# Patient Record
Sex: Female | Born: 1995 | Race: Black or African American | Hispanic: No | Marital: Single | Smoking: Never smoker
Health system: Southern US, Community
[De-identification: ages and names within clinical notes are randomized; demographics above are authoritative.]

## PROBLEM LIST (undated history)

## (undated) ENCOUNTER — Inpatient Hospital Stay (HOSPITAL_COMMUNITY): Payer: Self-pay

## (undated) ENCOUNTER — Ambulatory Visit: Admission: EM | Payer: Medicaid Other | Source: Home / Self Care

## (undated) DIAGNOSIS — R011 Cardiac murmur, unspecified: Secondary | ICD-10-CM

## (undated) DIAGNOSIS — D649 Anemia, unspecified: Secondary | ICD-10-CM

## (undated) HISTORY — PX: TIBIA FRACTURE SURGERY: SHX806

---
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
## 2017-08-06 NOTE — L&D Delivery Note (Signed)
  Delivery Note At 9:15 PM a viable female was delivered via  (Presentation: ROA;  ).  APGAR: 8/9, ; weight pending  .The shoulders were not forthcoming, so the posterior (right) axilla was grasped with my index finger, and the baby was rotated clockwise into the oblique diameter.  At this point, the (now) anterior shoulder was released, and the baby delivered.  At no time was any traction placed on the baby's head.  After 1 minute, the cord was clamped and cut. 40 units of pitocin diluted in 1000cc LR was infused rapidly IV.  The placenta separated spontaneously and delivered via CCT and maternal pushing effort.  It was inspected and appears to be intact with a 3 VC. The uterus was boggy for a bit, so cytotec 800 mcg given PR.      Anesthesia:  epidrual Episiotomy:  none Lacerations:  1st degree perineal/labial Suture Repair:  Est. Blood Loss (mL):  350  Mom to postpartum.  Baby to Couplet care / Skin to Skin   The above was performed under my direct supervision and guidance by Dr. Shaune PascalSheran Abraham. [name]\ .  Brittney Wyatt 06/19/2018, 9:29 PM

## 2017-10-25 ENCOUNTER — Encounter (HOSPITAL_COMMUNITY): Payer: Self-pay

## 2017-10-25 ENCOUNTER — Other Ambulatory Visit: Payer: Self-pay

## 2017-10-25 ENCOUNTER — Emergency Department (HOSPITAL_COMMUNITY): Payer: Medicaid Other

## 2017-10-25 ENCOUNTER — Emergency Department (HOSPITAL_COMMUNITY)
Admission: EM | Admit: 2017-10-25 | Discharge: 2017-10-25 | Disposition: A | Discharge status: Home / Self Care | Payer: Medicaid Other | Attending: Emergency Medicine | Admitting: Emergency Medicine

## 2017-10-25 DIAGNOSIS — Z7722 Contact with and (suspected) exposure to environmental tobacco smoke (acute) (chronic): Secondary | ICD-10-CM | POA: Insufficient documentation

## 2017-10-25 DIAGNOSIS — Z3A01 Less than 8 weeks gestation of pregnancy: Secondary | ICD-10-CM | POA: Diagnosis not present

## 2017-10-25 DIAGNOSIS — R109 Unspecified abdominal pain: Secondary | ICD-10-CM | POA: Insufficient documentation

## 2017-10-25 DIAGNOSIS — O9989 Other specified diseases and conditions complicating pregnancy, childbirth and the puerperium: Secondary | ICD-10-CM | POA: Diagnosis present

## 2017-10-25 HISTORY — DX: Anemia, unspecified: D64.9

## 2017-10-25 HISTORY — DX: Cardiac murmur, unspecified: R01.1

## 2017-10-25 LAB — COMPREHENSIVE METABOLIC PANEL
ALBUMIN: 4.4 g/dL (ref 3.5–5.0)
ALT: 15 U/L (ref 14–54)
ANION GAP: 8 (ref 5–15)
AST: 22 U/L (ref 15–41)
Alkaline Phosphatase: 64 U/L (ref 38–126)
BILIRUBIN TOTAL: 0.5 mg/dL (ref 0.3–1.2)
BUN: 11 mg/dL (ref 6–20)
CHLORIDE: 107 mmol/L (ref 101–111)
CO2: 21 mmol/L — ABNORMAL LOW (ref 22–32)
Calcium: 9.5 mg/dL (ref 8.9–10.3)
Creatinine, Ser: 0.53 mg/dL (ref 0.44–1.00)
GFR calc Af Amer: >60 mL/min (ref >60)
GFR calc non Af Amer: >60 mL/min (ref >60)
GLUCOSE: 89 mg/dL (ref 65–99)
POTASSIUM: 3.6 mmol/L (ref 3.5–5.1)
Sodium: 136 mmol/L (ref 135–145)
TOTAL PROTEIN: 7.9 g/dL (ref 6.5–8.1)

## 2017-10-25 LAB — URINALYSIS, ROUTINE W REFLEX MICROSCOPIC
Bilirubin Urine: NEGATIVE
GLUCOSE, UA: NEGATIVE mg/dL
Hgb urine dipstick: NEGATIVE
Ketones, ur: NEGATIVE mg/dL
LEUKOCYTES UA: NEGATIVE
NITRITE: NEGATIVE
Protein, ur: NEGATIVE mg/dL
SPECIFIC GRAVITY, URINE: 1.025 (ref 1.005–1.030)
pH: 5 (ref 5.0–8.0)

## 2017-10-25 LAB — CBC
HCT: 41 % (ref 36.0–46.0)
HEMOGLOBIN: 14.3 g/dL (ref 12.0–15.0)
MCH: 31.3 pg (ref 26.0–34.0)
MCHC: 34.9 g/dL (ref 30.0–36.0)
MCV: 89.7 fL (ref 78.0–100.0)
Platelets: 336 10*3/uL (ref 150–400)
RBC: 4.57 MIL/uL (ref 3.87–5.11)
RDW: 11.9 % (ref 11.5–15.5)
WBC: 8.1 10*3/uL (ref 4.0–10.5)

## 2017-10-25 LAB — LIPASE, BLOOD: LIPASE: 26 U/L (ref 11–51)

## 2017-10-25 LAB — I-STAT BETA HCG BLOOD, ED (MC, WL, AP ONLY): I-stat hCG, quantitative: >2000 m[IU]/mL — ABNORMAL HIGH (ref <5)

## 2017-10-25 LAB — HCG, QUANTITATIVE, PREGNANCY: HCG, BETA CHAIN, QUANT, S: 9790 m[IU]/mL — AB (ref <5)

## 2017-10-25 MED ORDER — PRENATAL COMPLETE 14-0.4 MG PO TABS
1.0000 | ORAL_TABLET | Freq: Every day | ORAL | 0 refills | Status: DC
Start: 2017-10-25 — End: 2018-06-18

## 2017-10-25 MED ORDER — METOCLOPRAMIDE HCL 10 MG PO TABS: 10.0000 mg | ORAL_TABLET | Freq: Three times a day (TID) | ORAL | 0 refills | Status: DC | PRN

## 2017-10-25 MED ORDER — ONDANSETRON HCL 4 MG/2ML IJ SOLN
4.0000 mg | Freq: Once | INTRAMUSCULAR | Status: AC
  Administered 2017-10-25: 4 mg via INTRAVENOUS
  Filled 2017-10-25: qty 2

## 2017-10-25 MED ORDER — SODIUM CHLORIDE 0.9 % IV BOLUS (SEPSIS)
1000.0000 mL | Freq: Once | INTRAVENOUS | Status: AC
  Administered 2017-10-25: 1000 mL via INTRAVENOUS

## 2017-10-25 NOTE — Discharge Instructions (Signed)
Your due date now appears to be November 18.

## 2017-10-25 NOTE — ED Provider Notes (Signed)
[facility]-EMERGENCY DEPT Provider Note   CSN: [id number] Arrival date & time: 10/25/17  0859     History   Chief Complaint Chief Complaint  Patient presents with  . Emesis  . Abdominal Pain    HPI Brittney Wyatt is a 22 y.o. female.  HPI Patient presents with lower abdominal pain for last 3 days.  Also has had nausea and vomiting.  No diarrhea.  Pain is dull.  It is constant.  No vaginal bleeding or discharge.  Eating does not change the pain.  Has had a somewhat decreased appetite.  No dysuria.  Patient does not know if she is pregnant.  Has had some chills.  No sick contacts. Past Medical History:  Diagnosis Date  . Anemia   . Heart murmur     There are no active problems to display for this patient.   Past Surgical History:  Procedure Laterality Date  . TIBIA FRACTURE SURGERY Right     OB History   None      Home Medications    Prior to Admission medications   Medication Sig Start Date End Date Taking? Authorizing Provider  metoCLOPramide (REGLAN) 10 MG tablet Take 1 tablet (10 mg total) by mouth every 8 (eight) hours as needed for nausea. 10/25/17   Benjiman Core, MD  Prenatal Vit-Fe Fumarate-FA (PRENATAL COMPLETE) 14-0.4 MG TABS Take 1 tablet by mouth daily. 10/25/17   Benjiman Core, MD    Family History Family History  Problem Relation Age of Onset  . Hypertension Mother   . Diabetes Mother     Social History Social History   Tobacco Use  . Smoking status: Passive Smoke Exposure - Never Smoker  . Smokeless tobacco: Never Used  Substance Use Topics  . Alcohol use: No  . Drug use: No     Allergies   Patient has no known allergies.   Review of Systems Review of Systems  Constitutional: Positive for chills. Negative for fever.  HENT: Negative for congestion.   Respiratory: Negative for shortness of breath.   Cardiovascular: Negative for chest pain.  Gastrointestinal: Positive for abdominal pain, nausea and  vomiting.  Genitourinary: Negative for vaginal bleeding, vaginal discharge and vaginal pain.  Musculoskeletal: Negative for back pain.  Skin: Negative for rash.  Neurological: Positive for speech difficulty.  Hematological: Negative for adenopathy.  Psychiatric/Behavioral: Negative for confusion.     Physical Exam Updated Vital Signs BP 121/69 (BP Location: Right Arm)   Pulse 82   Temp 98.2 F (36.8 C) (Oral)   Resp 14   Ht 5\' 3"  (1.6 m)   Wt 51.3 kg (113 lb)   LMP 09/13/2017   SpO2 100%   BMI 20.02 kg/m   Physical Exam  Constitutional: She appears well-developed.  HENT:  Head: Atraumatic.  Eyes: Pupils are equal, round, and reactive to light.  Cardiovascular: Regular rhythm.  Pulmonary/Chest: Breath sounds normal.  Abdominal:  Lower abdominal tenderness without rebound or guarding.  Neurological: She is alert.  Skin: Skin is warm. Capillary refill takes less than 2 seconds.     ED Treatments / Results  Labs (all labs ordered are listed, but only abnormal results are displayed) Labs Reviewed  COMPREHENSIVE METABOLIC PANEL - Abnormal; Notable for the following components:      Result Value   CO2 21 (*)    All other components within normal limits  URINALYSIS, ROUTINE W REFLEX MICROSCOPIC - Abnormal; Notable for the following components:   APPearance HAZY (*)  All other components within normal limits  HCG, QUANTITATIVE, PREGNANCY - Abnormal; Notable for the following components:   hCG, Beta Chain, Quant, S 9,790 (*)    All other components within normal limits  I-STAT BETA HCG BLOOD, ED ([facility], [facility], [facility] ONLY) - Abnormal; Notable for the following components:   I-stat hCG, quantitative >2,000.0 (*)    All other components within normal limits  LIPASE, BLOOD  CBC    EKG  EKG Interpretation None       Radiology [place] Ob Comp < 14 Wks  Result Date: 10/25/2017 CLINICAL DATA:  Pelvic pain with positive pregnancy test EXAM: OBSTETRIC <14 WK US AND TRANSVAGINAL  OB US TECHNIQUE: Both transabdominal and transvaginal ultrasound examinations were performed for complete evaluation of the gestation as well as the maternal uterus, adnexal regions, and pelvic cul-de-sac. Transvaginal technique was performed to assess early pregnancy. COMPARISON:  None. FINDINGS: Intrauterine gestational sac: Visualized Yolk sac:  Visualized Embryo:  Not visualized Cardiac Activity: Not visualized MSD: 9 mm   5 w   5 d Subchorionic hemorrhage:  None visualized. Maternal uterus/adnexae: Cervical os closed. Maternal adnexal structures bilaterally appear unremarkable. Trace free pelvic fluid noted. IMPRESSION: There is a gestational sac within the uterus which contains a yolk sac. Fetal pole currently not seen. Based on gestational sac size, estimated gestational age is 6-weeks. A follow-up study in 10-14 days to assess for fetal pole advised. Study otherwise unremarkable. Trace free pelvic fluid may be physiologic. Electronically Signed   By: Bretta BangWilliam  Woodruff III M.D.   On: 10/25/2017 12:01   [place] Ob Transvaginal  Result Date: 10/25/2017 CLINICAL DATA:  Pelvic pain with positive pregnancy test EXAM: OBSTETRIC <14 WK US AND TRANSVAGINAL OB US TECHNIQUE: Both transabdominal and transvaginal ultrasound examinations were performed for complete evaluation of the gestation as well as the maternal uterus, adnexal regions, and pelvic cul-de-sac. Transvaginal technique was performed to assess early pregnancy. COMPARISON:  None. FINDINGS: Intrauterine gestational sac: Visualized Yolk sac:  Visualized Embryo:  Not visualized Cardiac Activity: Not visualized MSD: 9 mm   5 w   5 d Subchorionic hemorrhage:  None visualized. Maternal uterus/adnexae: Cervical os closed. Maternal adnexal structures bilaterally appear unremarkable. Trace free pelvic fluid noted. IMPRESSION: There is a gestational sac within the uterus which contains a yolk sac. Fetal pole currently not seen. Based on gestational sac size, estimated  gestational age is 6-weeks. A follow-up study in 10-14 days to assess for fetal pole advised. Study otherwise unremarkable. Trace free pelvic fluid may be physiologic. Electronically Signed   By: Bretta BangWilliam  Woodruff III M.D.   On: 10/25/2017 12:01    Procedures Procedures (including critical care time)  Medications Ordered in ED Medications  sodium chloride 0.9 % bolus 1,000 mL (0 mLs Intravenous Stopped 10/25/17 1241)  ondansetron (ZOFRAN) injection 4 mg (4 mg Intravenous Given 10/25/17 0945)     Initial Impression / Assessment and Plan / ED Course  I have reviewed the triage vital signs and the nursing notes.  Pertinent labs & imaging results that were available during my care of the patient were reviewed by me and considered in my medical decision making (see chart for details).     Patient with nausea and vomiting.  Found to be pregnant.  Not ectopic on ultrasound.  No sickle discussed with patient and will follow up with outpatient clinic.  Pelvic exam deferred by patient.  Final Clinical Impressions(s) / ED Diagnoses   Final diagnoses:  Less than [redacted] weeks gestation  of pregnancy    ED Discharge Orders        Ordered    metoCLOPramide (REGLAN) 10 MG tablet  Every 8 hours PRN     10/25/17 1320    Prenatal Vit-Fe Fumarate-FA (PRENATAL COMPLETE) 14-0.4 MG TABS  Daily     10/25/17 1321       Benjiman Core, MD 10/25/17 1715

## 2017-10-25 NOTE — ED Triage Notes (Signed)
patient c/o mid abdominal pain and emesis x 3 days. Patient denies any diarrhea.

## 2017-12-03 ENCOUNTER — Telehealth: Payer: Self-pay | Admitting: Licensed Clinical Social Worker

## 2017-12-03 NOTE — Telephone Encounter (Signed)
CSW A. Felton Clinton contacted pt regarding scheduled visit. Pt confirmed appt

## 2017-12-04 ENCOUNTER — Encounter: Payer: Self-pay | Admitting: Advanced Practice Midwife

## 2017-12-04 ENCOUNTER — Other Ambulatory Visit: Payer: Self-pay

## 2017-12-04 ENCOUNTER — Other Ambulatory Visit (HOSPITAL_COMMUNITY)
Admission: RE | Admit: 2017-12-04 | Discharge: 2017-12-04 | Disposition: A | Discharge status: Home / Self Care | Payer: Medicaid Other | Source: Ambulatory Visit | Attending: Advanced Practice Midwife | Admitting: Advanced Practice Midwife

## 2017-12-04 ENCOUNTER — Ambulatory Visit (INDEPENDENT_AMBULATORY_CARE_PROVIDER_SITE_OTHER): Payer: Medicaid Other | Admitting: Advanced Practice Midwife

## 2017-12-04 ENCOUNTER — Encounter: Payer: Self-pay | Admitting: *Deleted

## 2017-12-04 VITALS — BP 122/70 | HR 97 | Wt 114.9 lb

## 2017-12-04 DIAGNOSIS — Z3401 Encounter for supervision of normal first pregnancy, first trimester: Secondary | ICD-10-CM

## 2017-12-04 DIAGNOSIS — Z3400 Encounter for supervision of normal first pregnancy, unspecified trimester: Secondary | ICD-10-CM

## 2017-12-04 DIAGNOSIS — Z3403 Encounter for supervision of normal first pregnancy, third trimester: Secondary | ICD-10-CM | POA: Insufficient documentation

## 2017-12-04 LAB — POCT URINALYSIS DIP (DEVICE)
Bilirubin Urine: NEGATIVE
GLUCOSE, UA: NEGATIVE mg/dL
Hgb urine dipstick: NEGATIVE
KETONES UR: 40 mg/dL — AB
Leukocytes, UA: NEGATIVE
Nitrite: NEGATIVE
PH: 7 (ref 5.0–8.0)
PROTEIN: 30 mg/dL — AB
SPECIFIC GRAVITY, URINE: 1.02 (ref 1.005–1.030)
UROBILINOGEN UA: 0.2 mg/dL (ref 0.0–1.0)

## 2017-12-04 NOTE — Progress Notes (Signed)
Subjective:   Brittney Wyatt is a 22 y.o. G1P0 at [redacted]w[redacted]d by LMP being seen today for her first obstetrical visit.  Her obstetrical history is significant for teen pregnancy . Patient does intend to breast feed. Pregnancy history fully reviewed.  Patient currently living with her mother. She is planning on moving in with her sister or the FOB after she has the baby. She reports that "it's ok" living with her mother, but they don't get along that well. Her older sister lives in [place] and has one child of her own. They have a good relationship. She is unsure about birth control. She has used Nexplanon in the past, but had a lot of bleeding with it.  Patient reports nausea and vomiting.  HISTORY: OB History  Gravida Para Term Preterm AB Living  1 0 0 0 0 0  SAB TAB Ectopic Multiple Live Births  0 0 0 0 0    # Outcome Date GA Lbr Len/2nd Weight Sex Delivery Anes PTL Lv  1 Current             Last pap smear was done NA(age) and was NA  Past Medical History:  Diagnosis Date  . Anemia   . Heart murmur    Past Surgical History:  Procedure Laterality Date  . NO PAST SURGERIES    . TIBIA FRACTURE SURGERY Right    Family History  Problem Relation Age of Onset  . Hypertension Mother   . Diabetes Mother    Social History   Tobacco Use  . Smoking status: Passive Smoke Exposure - Never Smoker  . Smokeless tobacco: Never Used  Substance Use Topics  . Alcohol use: No  . Drug use: No   No Known Allergies Current Outpatient Medications on File Prior to Visit  Medication Sig Dispense Refill  . metoCLOPramide (REGLAN) 10 MG tablet Take 1 tablet (10 mg total) by mouth every 8 (eight) hours as needed for nausea. 10 tablet 0  . Prenatal Vit-Fe Fumarate-FA (PRENATAL COMPLETE) 14-0.4 MG TABS Take 1 tablet by mouth daily. (Patient not taking: Reported on 12/04/2017) 60 each 0   No current facility-administered medications on file prior to visit.     Review of Systems Pertinent items  noted in HPI and remainder of comprehensive ROS otherwise negative.  Exam   Vitals:   12/04/17 0913  BP: 122/70  Pulse: 97  Weight: 114 lb 14.4 oz (52.1 kg)      Uterus:   11 weeks +FHT with doppler   Pelvic Exam: Perineum: Defer pelvic exam today   Vulva:    Vagina:     Cervix:    Adnexa:    Bony Pelvis:   System: General: well-developed, well-nourished female in no acute distress   Breast:  normal appearance, no masses or tenderness   Skin: normal coloration and turgor, no rashes   Neurologic: oriented, normal, negative, normal mood   Extremities: normal strength, tone, and muscle mass, ROM of all joints is normal   HEENT PERRLA, extraocular movement intact and sclera clear, anicteric   Mouth/Teeth mucous membranes moist, pharynx normal without lesions and dental hygiene good   Neck supple and no masses   Cardiovascular: regular rate and rhythm   Respiratory:  no respiratory distress, normal breath sounds   Abdomen: soft, non-tender; bowel sounds normal; no masses,  no organomegaly    Assessment:   Pregnancy: G1P0 Patient Active Problem List   Diagnosis Date Noted  . Supervision of normal first teen pregnancy  12/04/2017     Plan:  1. Supervision of normal first pregnancy, antepartum - Culture, OB Urine - Cystic fibrosis gene test - GC/Chlamydia probe amp ([place])not at [facility] - Hemoglobinopathy Evaluation - Obstetric Panel, Including HIV - SMN1 COPY NUMBER ANALYSIS (SMA Carrier Screen) - HgB A1c - [facility] AMB BABYSCRIPTS OPT IN - Genetic Screening   Initial labs drawn. Continue prenatal vitamins. Genetic Screening discussed, AFP and NIPS: requested. Ultrasound discussed; fetal anatomic survey: requested. Problem list reviewed and updated. The nature of [facility] with multiple MDs and other Advanced Practice Providers was explained to patient; also emphasized that residents, students are part of our team. Routine  obstetric precautions reviewed. Return in about 1 month (around 01/01/2018).

## 2017-12-04 NOTE — Patient Instructions (Signed)
Childbirth Education Options: [facility] Department Classes:  Childbirth education classes can help you get ready for a positive parenting experience. You can also meet other expectant parents and get free stuff for your baby. Each class runs for five weeks on the same night and costs $45 for the mother-to-be and her support person. Medicaid covers the cost if you are eligible. Call [phone] to register. [facility] Childbirth Education:  [phone] or [phone] or [email or web address]  Baby & Me Class: Discuss newborn & infant parenting and family adjustment issues with other new mothers in a relaxed environment. Each week brings a new speaker or baby-centered activity. We encourage new mothers to join [place] every Thursday at 11:00am. Babies birth until crawling. No registration or fee. Daddy [employer]: This course offers Dads-to-be the tools and knowledge needed to feel confident on their journey to becoming new fathers. Experienced dads, who have been trained as coaches, teach dads-to-be how to hold, comfort, diaper, swaddle and play with their infant while being able to support the new mom as well. A class for men taught by men. $25/dad Big Brother/Big Sister: Let your children share in the joy of a new brother or sister in this special class designed just for them. Class includes discussion about how families care for babies: swaddling, holding, diapering, safety as well as how they can be helpful in their new role. This class is designed for children ages 45 to 48, but any age is welcome. Please register each child individually. $5/child  Mom Talk: This mom-led group offers support and connection to mothers as they journey through the adjustments and struggles of that sometimes overwhelming first year after the birth of a child. Tuesdays at 10:00am and Thursdays at 6:00pm. Babies welcome. No registration or fee. Breastfeeding Support Group: This group is a mother-to-mother  support circle where moms have the opportunity to share their breastfeeding experiences. A Lactation Consultant is present for questions and concerns. Meets each Tuesday at 11:00am. No fee or registration. Breastfeeding Your Baby: Learn what to expect in the first days of breastfeeding your newborn.  This class will help you feel more confident with the skills needed to begin your breastfeeding experience. Many new mothers are concerned about breastfeeding after leaving the hospital. This class will also address the most common fears and challenges about breastfeeding during the first few weeks, months and beyond. (call for fee) Comfort Techniques and Tour: This 2 hour interactive class will provide you the opportunity to learn & practice hands-on techniques that can help relieve some of the discomfort of labor and encourage your baby to rotate toward the best position for birth. You and your partner will be able to try a variety of labor positions with birth balls and rebozos as well as practice breathing, relaxation, and visualization techniques. A tour of the [facility] is included with this class. $20 per registrant and support person Childbirth Class- Weekend Option: This class is a Weekend version of our Birth & Baby series. It is designed for parents who have a difficult time fitting several weeks of classes into their schedule. It covers the care of your newborn and the basics of labor and childbirth. It also includes a [place] of [facility] and lunch. The class is held two consecutive days: beginning on Friday evening from 6:30 - 8:30 p.m. and the next day, Saturday from 9 a.m. - 4 p.m. (call for fee) Doren Custard Class: Interested in a waterbirth?  This  informational class will help you discover whether waterbirth is the right fit for you. Education about waterbirth itself, supplies you would need and how to assemble your support team is what you can  expect from this class. Some obstetrical practices require this class in order to pursue a waterbirth. (Not all obstetrical practices offer waterbirth-check with your healthcare provider.) Register only the expectant mom, but you are encouraged to bring your partner to class! Required if planning waterbirth, no fee. Infant/Child CPR: Parents, grandparents, babysitters, and friends learn Cardio-Pulmonary Resuscitation skills for infants and children. You will also learn how to treat both conscious and unconscious choking in infants and children. This Family & Friends program does not offer certification. Register each participant individually to ensure that enough mannequins are available. (Call for fee) Grandparent Love: Expecting a grandbaby? This class is for you! Learn about the latest infant care and safety recommendations and ways to support your own child as he or she transitions into the parenting role. Taught by Registered Nurses who are childbirth instructors, but most importantly...they are grandmothers too! $10/person. Childbirth Class- Natural Childbirth: This series of 5 weekly classes is for expectant parents who want to learn and practice natural methods of coping with the process of labor and childbirth. Relaxation, breathing, massage, visualization, role of the partner, and helpful positioning are highlighted. Participants learn how to be confident in their body's ability to give birth. This class will empower and help parents make informed decisions about their own care. Includes discussion that will help new parents transition into the immediate postpartum period. Maternity Care Center Tour of [facility] is included. We suggest taking this class between 25-32 weeks, but it's only a recommendation. $75 per registrant and one support person or $30 Medicaid. Childbirth Class- 3 week Series: This option of 3 weekly classes helps you and your labor partner prepare for childbirth. Newborn  care, labor & birth, cesarean birth, pain management, and comfort techniques are discussed and a Maternity Care Center Tour of [facility] is included. The class meets at the same time, on the same day of the week for 3 consecutive weeks beginning with the starting date you choose. $60 for registrant and one support person.  Marvelous Multiples: Expecting twins, triplets, or more? This class covers the differences in labor, birth, parenting, and breastfeeding issues that face multiples' parents. NICU tour is included. Led by a Certified Childbirth Educator who is the mother of twins. No fee. Caring for Baby: This class is for expectant and adoptive parents who want to learn and practice the most up-to-date newborn care for their babies. Focus is on birth through the first six weeks of life. Topics include feeding, bathing, diapering, crying, umbilical cord care, circumcision care and safe sleep. Parents learn to recognize symptoms of illness and when to call the pediatrician. Register only the mom-to-be and your partner or support person can plan to come with you! $10 per registrant and support person Childbirth Class- online option: This online class offers you the freedom to complete a Birth and Baby series in the comfort of your own home. The flexibility of this option allows you to review sections at your own pace, at times convenient to you and your support people. It includes additional video information, animations, quizzes, and extended activities. Get organized with helpful eClass tools, checklists, and trackers. Once you register online for the class, you will receive an email within a few days to accept the invitation and begin the class when the time   is right for you. The content will be available to you for 60 days. $60 for 60 days of online access for you and your support people.  Local Doulas: Natural Baby Doulas [email or web address] Tel:  [phone] [email or web address] [employer] [phone] [email or web address] The Labor Hassell Halim  (also do waterbirth tub rental) [phone] [email or web address] Triad Birth Doula [phone] [email or web address] NotebookDistributors.fi Sacred Rhythms  [phone] https://sacred-rhythms.com/ [employer] (PADA) [email or web address] https://www.frey.org/ [place] Birth and Baby  http://[email or web address]/ Considering Waterbirth? Guide for patients at [facility] [employer]  Why consider waterbirth?  . Gentle birth for babies . Less pain medicine used in labor . May allow for passive descent/less pushing . May reduce perineal tears  . More mobility and instinctive maternal position changes . Increased maternal relaxation . Reduced blood pressure in labor  Is waterbirth safe? What are the risks of infection, drowning or other complications?  . Infection: o Very low risk (3.7 % for tub vs 4.8% for bed) o 7 in 8000 waterbirths with documented infection o Poorly cleaned equipment most common cause o Slightly lower group B strep transmission rate  . Drowning o Maternal:  - Very low risk   - Related to seizures or fainting o Newborn:  - Very low risk. No evidence of increased risk of respiratory problems in multiple large studies - Physiological protection from breathing under water - Avoid underwater birth if there are any fetal complications - Once baby's head is out of the water, keep it out.  . Birth complication o Some reports of cord trauma, but risk decreased by bringing baby to surface gradually o No evidence of increased risk of shoulder dystocia. Mothers can usually change positions faster in water than in a bed, possibly aiding the maneuvers to free the shoulder.   You must attend a Doren Custard class at [facility]  3rd Wednesday of every month from 7-9pm  [employer] by calling [phone] or online at [email or web address]  Bring [place] the certificate from the class to your prenatal appointment  Meet with a midwife at 36 weeks to see if you can still plan a waterbirth and to sign the consent.   Purchase or rent the following supplies:   Water Birth Pool (Birth Pool in a Box or [place] for instance)  (Tubs start ~$125)  Single-use disposable tub liner designed for your brand of tub  New garden hose labeled "lead-free", "suitable for drinking water",  Electric drain pump to remove water (We recommend 792 gallon per hour or greater pump.)   Separate garden hose to remove the dirty water  Fish net  Bathing suit top (optional)  Long-handled mirror (optional)  Places to purchase or rent supplies  GotWebTools.is for tub purchases and supplies  Waterbirthsolutions.com for tub purchases and supplies  The Labor Ladies (www.thelaborladies.com) $275 for tub rental/set-up & take down/kit   [employer] (http://www.fleming.com/.htm) Information regarding doulas (labor support) who provide pool rentals  Our practice has a Birth Pool in a Box tub at the hospital that you may borrow on a first-come-first-served basis. It is your responsibility to to set up, clean and break down the tub. We cannot guarantee the availability of this tub in advance. You are responsible for bringing all accessories listed above. If you do not have all necessary supplies you cannot have a waterbirth.    Things that would prevent you from having a waterbirth:  Premature, <37wks  Previous cesarean birth  Presence of thick meconium-stained fluid  Multiple gestation (Twins,  triplets, etc.)  Uncontrolled diabetes or gestational diabetes requiring medication  Hypertension requiring medication or diagnosis of pre-eclampsia  Heavy vaginal bleeding  Non-reassuring fetal  heart rate  Active infection (MRSA, etc.). Group B Strep is NOT a contraindication for  waterbirth.  If your labor has to be induced and induction method requires continuous  monitoring of the baby's heart rate  Other risks/issues identified by your obstetrical provider  Please remember that birth is unpredictable. Under certain unforeseeable circumstances your provider may advise against giving birth in the tub. These decisions will be made on a case-by-case basis and with the safety of you and your baby as our highest priority.   AREA PEDIATRIC/FAMILY PRACTICE PHYSICIANS  [facility] [address], [address] [place], [place]  [address] Phone - [phone]   Fax - [phone]  [facility] [address] [place], [place] [address] Phone - [phone]   Fax - [phone]  JACK AMOS [address] [place], [place]  [address] Phone - [phone]   Fax - [phone]  [facility] [address] [place], [place]  [address] Phone - [phone]   Fax - [phone]  [facility] [address] [place], [place]  [address] Phone - [phone]   Fax - [phone]  [facility] [address] [place], [place]  [address] Phone - [phone]   Fax - [phone]  [facility] [address] [place], [place]  [address] Phone - [phone]   Fax - [phone]  [facility] [address] [place], [place]  [address] Phone - [phone]   Fax - [phone]  [facility] [address] [place], [place]  [address] Phone - [phone]   Fax - [phone] [facility] [address] [place], [place]  [address] Phone - [phone]   Fax - [phone]  [facility] [address] [place], [place]  [address] Phone -  [phone]   Fax - [phone]  [facility] [address] [place], [place]  [address] Phone - [phone]   Fax - [phone]  [facility] [address] [place], [place]  [address] Phone - [phone]   Fax - [phone]  SHILPA GOSRANI [address] [place], [place]  [address] Phone - [phone]  [facility] [address] [place], [place]  [address] Phone - [phone]   Fax - [phone]  [facility] [address] [place], [place]  [address] Phone - [phone]   Fax - [phone]  [facility] [address] [place], [place]  [address] Phone - [phone]   Fax - [phone]  [facility] [address] [place], [place]  [address] Phone - [phone]   Fax - [phone]   [facility] [address] [place], [place]  [address] Phone - [phone]   Fax - [phone]  [facility] [address] [place], [place]  [address] Phone - [phone]   Fax - [phone]  DAVID RUBIN [address] [place], [place]  [address] Phone - [phone]   Fax - [phone]  [facility] [address] [place], [place]  [address] Phone - [phone]   Fax - [phone]  [facility] [address]   [place], [place]  [address] Phone - [phone]   Fax - [place], [place]  [address] Phone - [phone]   Fax - [place] [address] [place], [place]  [address] Phone - [phone]   Fax - [place] [address] [place], [place]  [address] Phone - [phone]   Fax - [phone]  Hyde MD [address] [place] [address] Phone [phone]  Fax [phone]  Safe Medications in Pregnancy   Acne: Benzoyl Peroxide Salicylic Acid  Backache/Headache: Tylenol: 2 regular strength every 4 hours OR              2 Extra strength every 6 hours  Colds/Coughs/Allergies: Benadryl (alcohol free) 25 mg every 6 hours as needed Breath right strips Claritin Cepacol throat lozenges Chloraseptic throat spray Cold-Eeze- up to three times per day Cough drops, alcohol free Flonase (by prescription only) Guaifenesin Mucinex Robitussin DM (plain only, alcohol free) Saline nasal spray/drops Sudafed (pseudoephedrine) & Actifed ** use only after [redacted] weeks gestation and if you do not have high blood pressure Tylenol Vicks Vaporub Zinc lozenges Zyrtec   Constipation: Colace Ducolax suppositories Fleet enema Glycerin suppositories Metamucil Milk of magnesia Miralax Senokot Smooth move tea  Diarrhea: Kaopectate Imodium A-D  *NO pepto Bismol  Hemorrhoids: Anusol Anusol HC Preparation H Tucks  Indigestion: Tums Maalox Mylanta Zantac  Pepcid  Insomnia: Benadryl (alcohol free) 65m every 6 hours as needed Tylenol PM Unisom, no Gelcaps  Leg Cramps: Tums MagGel  Nausea/Vomiting:  Bonine Dramamine Emetrol Ginger extract Sea bands Meclizine  Nausea medication to take during pregnancy:  Unisom (doxylamine succinate 25 mg tablets) Take one tablet daily at bedtime. If symptoms are not adequately controlled, the dose can be increased to a maximum recommended dose of two tablets daily (1/2 tablet in the morning, 1/2 tablet mid-afternoon and one at bedtime). Vitamin B6 106mtablets. Take one tablet twice a day (up to 200 mg per day).  Skin Rashes: Aveeno products Benadryl cream or 2565mvery 6 hours as needed Calamine Lotion 1% cortisone cream  Yeast infection: Gyne-lotrimin 7 Monistat 7   **If taking multiple medications, please check labels to avoid  duplicating the same active ingredients **take medication as directed on the label ** Do not exceed 4000 mg of tylenol in 24 hours **Do not take medications that contain aspirin or ibuprofen

## 2017-12-05 LAB — GC/CHLAMYDIA PROBE AMP (~~LOC~~) NOT AT ARMC
Chlamydia: POSITIVE — AB
Neisseria Gonorrhea: NEGATIVE

## 2017-12-07 LAB — CULTURE, OB URINE

## 2017-12-07 LAB — URINE CULTURE, OB REFLEX: ORGANISM ID, BACTERIA: NO GROWTH

## 2017-12-08 ENCOUNTER — Encounter: Payer: Self-pay | Admitting: Advanced Practice Midwife

## 2017-12-09 ENCOUNTER — Other Ambulatory Visit: Payer: Self-pay

## 2017-12-09 MED ORDER — AZITHROMYCIN 250 MG PO TABS: 1000.0000 mg | ORAL_TABLET | Freq: Once | ORAL | 0 refills | Status: AC

## 2017-12-09 NOTE — Progress Notes (Signed)
Pt tested + for chlamydia. Rx e-prescribed per protocol.

## 2017-12-09 NOTE — Progress Notes (Signed)
STD Card faxed to [facility] 12/09/17

## 2017-12-10 ENCOUNTER — Encounter: Payer: Self-pay | Admitting: Advanced Practice Midwife

## 2017-12-10 NOTE — Progress Notes (Signed)
Pt notified via My Chart

## 2017-12-11 ENCOUNTER — Encounter: Payer: Self-pay | Admitting: Advanced Practice Midwife

## 2017-12-11 ENCOUNTER — Encounter: Payer: Self-pay | Admitting: *Deleted

## 2017-12-11 LAB — OBSTETRIC PANEL, INCLUDING HIV
Antibody Screen: NEGATIVE
BASOS: 0 %
Basophils Absolute: 0 10*3/uL (ref 0.0–0.2)
EOS (ABSOLUTE): 0 10*3/uL (ref 0.0–0.4)
EOS: 0 %
HEMATOCRIT: 33.8 % — AB (ref 34.0–46.6)
HEMOGLOBIN: 11.1 g/dL (ref 11.1–15.9)
HEP B S AG: NEGATIVE
HIV Screen 4th Generation wRfx: NONREACTIVE
IMMATURE GRANULOCYTES: 0 %
Immature Grans (Abs): 0 10*3/uL (ref 0.0–0.1)
LYMPHS: 7 %
Lymphocytes Absolute: 0.9 10*3/uL (ref 0.7–3.1)
MCH: 29.9 pg (ref 26.6–33.0)
MCHC: 32.8 g/dL (ref 31.5–35.7)
MCV: 91 fL (ref 79–97)
MONOCYTES: 9 %
Monocytes Absolute: 1.1 10*3/uL — ABNORMAL HIGH (ref 0.1–0.9)
NEUTROS PCT: 84 %
Neutrophils Absolute: 10.1 10*3/uL — ABNORMAL HIGH (ref 1.4–7.0)
Platelets: 269 10*3/uL (ref 150–379)
RBC: 3.71 x10E6/uL — AB (ref 3.77–5.28)
RDW: 13.3 % (ref 12.3–15.4)
RH TYPE: POSITIVE
RPR: NONREACTIVE
RUBELLA: 3.04 {index} (ref >0.99)
WBC: 12.1 10*3/uL — ABNORMAL HIGH (ref 3.4–10.8)

## 2017-12-11 LAB — HEMOGLOBINOPATHY EVALUATION
FERRITIN: 169 ng/mL — AB (ref 15–77)
HGB SOLUBILITY: NEGATIVE
Hgb A2 Quant: 1.1 % — ABNORMAL LOW (ref 1.8–3.2)
Hgb A: 98.9 % — ABNORMAL HIGH (ref 96.4–98.8)
Hgb C: 0 %
Hgb F Quant: 0 % (ref 0.0–2.0)
Hgb S: 0 %
Hgb Variant: 0 %

## 2017-12-11 LAB — HEMOGLOBIN A1C
ESTIMATED AVERAGE GLUCOSE: 100 mg/dL
Hgb A1c MFr Bld: 5.1 % (ref 4.8–5.6)

## 2017-12-11 LAB — SMN1 COPY NUMBER ANALYSIS (SMA CARRIER SCREENING)

## 2017-12-11 LAB — CYSTIC FIBROSIS GENE TEST

## 2017-12-31 ENCOUNTER — Encounter: Payer: Self-pay | Admitting: *Deleted

## 2018-01-06 ENCOUNTER — Encounter: Payer: Self-pay | Admitting: Advanced Practice Midwife

## 2018-01-06 ENCOUNTER — Ambulatory Visit (INDEPENDENT_AMBULATORY_CARE_PROVIDER_SITE_OTHER): Payer: Medicaid Other | Admitting: Advanced Practice Midwife

## 2018-01-06 VITALS — BP 131/58 | HR 96 | Wt 117.7 lb

## 2018-01-06 DIAGNOSIS — Z3400 Encounter for supervision of normal first pregnancy, unspecified trimester: Secondary | ICD-10-CM

## 2018-01-06 MED ORDER — PRENATAL PLUS 27-1 MG PO TABS: 1.0000 | ORAL_TABLET | Freq: Every day | ORAL | 12 refills | Status: DC

## 2018-01-06 NOTE — Progress Notes (Signed)
   PRENATAL VISIT NOTE  Subjective:  Brittney Wyatt is a 22 y.o. G1P0 at 2852w3d being seen today for ongoing prenatal care.  She is currently monitored for the following issues for this low-risk pregnancy and has Supervision of normal first teen pregnancy on their problem list.  Patient reports no complaints.  Contractions: Not present. Vag. Bleeding: None.  Movement: Present. Denies leaking of fluid.   The following portions of the patient's history were reviewed and updated as appropriate: allergies, current medications, past family history, past medical history, past social history, past surgical history and problem list. Problem list updated.  Objective:   Vitals:   01/06/18 1135  BP: (!) 131/58  Pulse: 96  Weight: 117 lb 11.2 oz (53.4 kg)    Fetal Status: Fetal Heart Rate (bpm): 153 Fundal Height: 16 cm Movement: Present     General:  Alert, oriented and cooperative. Patient is in no acute distress.  Skin: Skin is warm and dry. No rash noted.   Cardiovascular: Normal heart rate noted  Respiratory: Normal respiratory effort, no problems with respiration noted  Abdomen: Soft, gravid, appropriate for gestational age.  Pain/Pressure: Absent     Pelvic: Cervical exam deferred        Extremities: Normal range of motion.  Edema: None  Mental Status: Normal mood and affect. Normal behavior. Normal judgment and thought content.   Assessment and Plan:  Pregnancy: G1P0 at 8852w3d  1. Supervision of normal first pregnancy, antepartum - US MFM OB COMP + 14 WK; Future - AFP, Serum, Open Spina Bifida  Preterm labor symptoms and general obstetric precautions including but not limited to vaginal bleeding, contractions, leaking of fluid and fetal movement were reviewed in detail with the patient. Please refer to After Visit Summary for other counseling recommendations.  Return in about 1 month (around 02/03/2018).  Future Appointments  Date Time Provider Department Center  01/24/2018  1:00  PM [facility] US 3 [facility]    Thressa ShellerHeather Hogan, CNM

## 2018-01-08 ENCOUNTER — Encounter: Payer: Self-pay | Admitting: Family Medicine

## 2018-01-08 DIAGNOSIS — O98819 Other maternal infectious and parasitic diseases complicating pregnancy, unspecified trimester: Secondary | ICD-10-CM

## 2018-01-08 DIAGNOSIS — A749 Chlamydial infection, unspecified: Secondary | ICD-10-CM | POA: Insufficient documentation

## 2018-01-08 LAB — AFP, SERUM, OPEN SPINA BIFIDA
AFP MoM: 1.51
AFP VALUE AFPOSL: 67.7 ng/mL
Gest. Age on Collection Date: 16.3 weeks
Maternal Age At EDD: 20.3 yr
OSBR RISK 1 IN: 5323
Test Results:: NEGATIVE
WEIGHT: 114 [lb_av]

## 2018-01-14 ENCOUNTER — Encounter: Payer: Self-pay | Admitting: Advanced Practice Midwife

## 2018-01-14 ENCOUNTER — Encounter: Payer: Self-pay | Admitting: General Practice

## 2018-01-17 ENCOUNTER — Encounter (HOSPITAL_COMMUNITY): Payer: Self-pay

## 2018-01-24 ENCOUNTER — Ambulatory Visit (HOSPITAL_COMMUNITY)
Admission: RE | Admit: 2018-01-24 | Discharge: 2018-01-24 | Disposition: A | Discharge status: Home / Self Care | Payer: Medicaid Other | Source: Ambulatory Visit | Attending: Advanced Practice Midwife | Admitting: Advanced Practice Midwife

## 2018-01-24 ENCOUNTER — Other Ambulatory Visit: Payer: Self-pay | Admitting: Advanced Practice Midwife

## 2018-01-24 DIAGNOSIS — Z3A19 19 weeks gestation of pregnancy: Secondary | ICD-10-CM | POA: Insufficient documentation

## 2018-01-24 DIAGNOSIS — Z3689 Encounter for other specified antenatal screening: Secondary | ICD-10-CM | POA: Diagnosis not present

## 2018-01-24 DIAGNOSIS — Z3400 Encounter for supervision of normal first pregnancy, unspecified trimester: Secondary | ICD-10-CM

## 2018-02-03 ENCOUNTER — Ambulatory Visit (INDEPENDENT_AMBULATORY_CARE_PROVIDER_SITE_OTHER): Payer: Medicaid Other | Admitting: Advanced Practice Midwife

## 2018-02-03 ENCOUNTER — Other Ambulatory Visit (HOSPITAL_COMMUNITY)
Admission: RE | Admit: 2018-02-03 | Discharge: 2018-02-03 | Disposition: A | Discharge status: Home / Self Care | Payer: Medicaid Other | Source: Ambulatory Visit | Attending: Advanced Practice Midwife | Admitting: Advanced Practice Midwife

## 2018-02-03 ENCOUNTER — Encounter: Payer: Self-pay | Admitting: Advanced Practice Midwife

## 2018-02-03 ENCOUNTER — Other Ambulatory Visit: Payer: Self-pay

## 2018-02-03 VITALS — BP 117/52 | HR 92 | Wt 123.0 lb

## 2018-02-03 DIAGNOSIS — Z3400 Encounter for supervision of normal first pregnancy, unspecified trimester: Secondary | ICD-10-CM | POA: Diagnosis present

## 2018-02-03 DIAGNOSIS — Z3402 Encounter for supervision of normal first pregnancy, second trimester: Secondary | ICD-10-CM | POA: Diagnosis not present

## 2018-02-03 NOTE — Patient Instructions (Addendum)
BENEFITS OF BREASTFEEDING Many women wonder if they should breastfeed. Research shows that breast milk contains the perfect balance of vitamins, protein and fat that your baby needs to grow. It also contains antibodies that help your baby's immune system to fight off viruses and bacteria and can reduce the risk of sudden infant death syndrome (SIDS). In addition, the colostrum (a fluid secreted from the breast in the first few days after delivery) helps your newborn's digestive system to grow and function well. Breast milk is easier to digest than formula. Also, if your baby is born preterm, breast milk can help to reduce both short- and long-term health problems. BENEFITS OF BREASTFEEDING FOR MOM . Breastfeeding causes a hormone to be released that helps the uterus to contract and return to its normal size more quickly. . It aids in postpartum weight loss, reduces risk of breast and ovarian cancer, heart disease and rheumatoid arthritis. . It decreases the amount of bleeding after the baby is born. benefits of breastfeeding for baby . Provides comfort and nutrition . Protects baby against - Obesity - Diabetes - Asthma - Childhood cancers - Heart disease - Ear infections - Diarrhea - Pneumonia - Stomach problems - Serious allergies - Skin rashes . Promotes growth and development . Reduces the risk of baby having Sudden Infant Death Syndrome (SIDS) only breastmilk for the first 6 months . Protects baby against diseases/allergies . It's the perfect amount for tiny bellies . It restores baby's energy . Provides the best nutrition for baby . Giving water or formula can make baby more likely to get sick, decrease Mom's milk supply, make baby less content with breastfeeding Skin to Skin After delivery, the staff will place your baby on your chest. This helps with the following: . Regulates baby's temperature, breathing, heart rate and blood sugar . Increases Mom's milk supply . Promotes  bonding . Keeps baby and Mom calm and decreases baby's crying Rooming In Your baby will stay in your room with you for the entire time you are in the hospital. This helps with the following: . Allows Mom to learn baby's feeding cues - Fluttering eyes - Sucking on tongue or hand - Rooting (opens mouth and turns head) - Nuzzling into the breast - Bringing hand to mouth . Allows breastfeeding on demand (when your baby is ready) . Helps baby to be calm and content . Ensures a good milk supply . Prevents complications with breastfeeding . Allows parents to learn to care for baby . Allows you to request assistance with breastfeeding Importance of a good latch . Increases milk transfer to baby - baby gets enough milk . Ensures you have enough milk for your baby . Decreases nipple soreness . Don't use pacifiers and bottles - these cause baby to suck differently than breastfeeding . Promotes continuation of breastfeeding Risks of Formula Supplementation with Breastfeeding Giving your infant formula in addition to your breast-milk EXCEPT when medically necessary can lead to: . Decreases your milk supply  . Loss of confidence in yourself for providing baby's nutrition  . Engorgement and possibly mastitis  . Asthma & allergies in the baby BREASTFEEDING FAQS How long should I breastfeed my baby? It is recommended that you provide your baby with breast milk only for the first 6 months and then continue for the first year and longer as desired. During the first few weeks after birth, your baby will need to feed 8-12 times every 24 hours, or every 2-3 hours. They will likely feed   for 15-30 minutes. How can I help my baby begin breastfeeding? Babies are born with an instinct to breastfeed. A healthy baby can begin breastfeeding right away without specific help. At the hospital, a nurse (or lactation consultant) will help you begin the process and will give you tips on good positioning. It may be  helpful to take a breastfeeding class before you deliver in order to know what to expect. How can I help my baby latch on? In order to assist your baby in latching-on, cup your breast in your hand and stroke your baby's lower lip with your nipple to stimulate your baby's rooting reflex. Your baby will look like he or she is yawning, at which point you should bring the baby towards your breast, while aiming the nipple at the roof of his or her mouth. Remember to bring the baby towards you and not your breast towards the baby. How can I tell if my baby is latched-on? Your baby will have all of your nipple and part of the dark area around the nipple in his or her mouth and your baby's nose will be touching your breast. You should see or hear the baby swallowing. If the baby is not latched-on properly, start the process over. To remove the suction, insert a clean finger between your breast and the baby's mouth. Should I switch breasts during feeding? After feeding on one side, switch the baby to your other breast. If he or she does not continue feeding - that is OK. Your baby will not necessarily need to feed from both breasts in a single feeding. On the next feeding, start with the other breast for efficiency and comfort. How can I tell if my baby is hungry? When your baby is hungry, they will nuzzle against your breast, make sucking noises and tongue motions and may put their hands near their mouth. Crying is a late sign of hunger, so you should not wait until this point. When they have received enough milk, they will unlatch from the breast. Is it okay to use a pacifier? Until your baby gets the hang of breastfeeding, experts recommend limiting pacifier usage. If you have questions about this, please contact your pediatrician. What can I do to ensure proper nutrition while breastfeeding? . Make sure that you support your own health and your baby's by eating a healthy, well-balanced diet . Your provider  may recommend that you continue to take your prenatal vitamin . Drink plenty of fluids. It is a good rule to drink one glass of water before or after feeding . Alcohol will remain in the breast milk for as long as it will remain in the blood stream. If you choose to have a drink, it is recommended that you wait at least 2 hours before feeding . Moderate amounts of caffeine are OK . Some over-the-counter or prescription medications are not recommended during breastfeeding. Check with your provider if you have questions What types of birth control methods are safe while breastfeeding? Progestin-only methods, including a daily pill, an IUD, the implant and the injection are safe while breastfeeding. Methods that contain estrogen (such as combination birth control pills, the vaginal ring and the patch) should not be used during the first month of breastfeeding as these can decrease your milk supply.  Places to have your son circumcised:    [facility] [phone] while you are in hospital  [facility] [phone] $244 by 4 wks  [facility] [phone] $175 by 2 wks  [facility] [phone] $250 by 7 days [facility] [phone] $269 by 4 wks  These prices sometimes change but are roughly what you can expect to pay. Please call and confirm pricing.   Circumcision is considered an elective/non-medically necessary procedure. There are many reasons parents decide to have their sons circumsized. During the first year of life circumcised males have a reduced risk of urinary tract infections but after this year the rates between circumcised males and uncircumcised males are the same.  It is safe to have your son circumcised outside of the hospital and the places above perform them regularly.   Deciding about Circumcision in Baby Boys   (Up-to-date The Basics)  What is circumcision?  Circumcision is a surgery that removes the skin that covers the tip of the penis, called the "foreskin" Circumcision is usually done when a boy is between 71 and 6210 days old. In the [place], circumcision is common. In some other countries, fewer boys are circumcised. Circumcision is a common tradition in some religions.  Should I have my baby boy circumcised?  There is no easy answer. Circumcision has some benefits. But it also has risks. After talking with your doctor, you will have to decide for yourself what is right for your family.  What are the benefits of circumcision?  Circumcised boys seem to have slightly lower rates of: ?Urinary tract infections ?Swelling of the opening at the tip of the penis Circumcised men seem to have slightly lower rates of: ?Urinary tract infections ?Swelling of the opening at the tip of the penis ?Penis cancer ?HIV and other infections that you catch during sex ?Cervical cancer in the women they have sex with Even so, in the [place], the risks of these problems are small - even in boys and men who have not been circumcised. Plus, boys and men who are not circumcised can reduce these extra risks by: ?Cleaning their penis well ?Using condoms during sex  What are the risks of circumcision?  Risks include: ?Bleeding or infection from the surgery ?Damage to or amputation of the penis ?A chance that the doctor will cut off too much or not enough of the foreskin ?A chance that sex won't feel as good later in life Only about 1 out of every 200 circumcisions leads to problems. There is also a chance that your health insurance won't pay for circumcision.  How is circumcision done in baby boys?  First, the baby gets medicine for pain relief. This might be a cream on the skin or a shot into the base of the penis. Next, the doctor cleans the baby's penis well. Then he or she uses special tools to  cut off the foreskin. Finally, the doctor wraps a bandage (called gauze) around the baby's penis. If you have your baby circumcised, his doctor or nurse will give you instructions on how to care for him after the surgery. It is important that you follow those instructions carefully.

## 2018-02-03 NOTE — Progress Notes (Signed)
   PRENATAL VISIT NOTE  Subjective:  Brittney Wyatt is Brittney Wyatt 22 y.o. G1P0 at 7578w3d being seen today for ongoing prenatal care.  She is currently monitored for the following issues for this low-risk pregnancy and has Supervision of normal first teen pregnancy and Chlamydia infection affecting pregnancy on their problem list.  Patient reports no complaints.  Contractions: Not present. Vag. Bleeding: None.  Movement: Present. Denies leaking of fluid.   The following portions of the patient's history were reviewed and updated as appropriate: allergies, current medications, past family history, past medical history, past social history, past surgical history and problem list. Problem list updated.  Objective:   Vitals:   02/03/18 1138  BP: (!) 117/52  Pulse: 92  Weight: 123 lb (55.8 kg)    Fetal Status: Fetal Heart Rate (bpm): 148 Fundal Height: 20 cm Movement: Present     General:  Alert, oriented and cooperative. Patient is in no acute distress.  Skin: Skin is warm and dry. No rash noted.   Cardiovascular: Normal heart rate noted  Respiratory: Normal respiratory effort, no problems with respiration noted  Abdomen: Soft, gravid, appropriate for gestational age.  Pain/Pressure: Absent     Pelvic: Cervical exam deferred        Extremities: Normal range of motion.  Edema: None  Mental Status: Normal mood and affect. Normal behavior. Normal judgment and thought content.   Assessment and Plan:  Pregnancy: G1P0 at 2678w3d  1. Supervision of normal first pregnancy, antepartum - Cervicovaginal ancillary only  Preterm labor symptoms and general obstetric precautions including but not limited to vaginal bleeding, contractions, leaking of fluid and fetal movement were reviewed in detail with the patient. Please refer to After Visit Summary for other counseling recommendations.  Return in about 1 month (around 03/03/2018).  No future appointments.  Thressa ShellerHeather Hogan, CNM

## 2018-02-04 LAB — CERVICOVAGINAL ANCILLARY ONLY
CHLAMYDIA, DNA PROBE: NEGATIVE
Neisseria Gonorrhea: NEGATIVE

## 2018-03-10 ENCOUNTER — Ambulatory Visit (INDEPENDENT_AMBULATORY_CARE_PROVIDER_SITE_OTHER): Payer: Medicaid Other | Admitting: Advanced Practice Midwife

## 2018-03-10 VITALS — BP 123/58 | HR 67 | Wt 126.0 lb

## 2018-03-10 DIAGNOSIS — Z3402 Encounter for supervision of normal first pregnancy, second trimester: Secondary | ICD-10-CM

## 2018-03-10 NOTE — Progress Notes (Signed)
   PRENATAL VISIT NOTE  Subjective:  Brittney Wyatt is a 22 y.o. G1P0 at 5736w3d being seen today for ongoing prenatal care.  She is currently monitored for the following issues for this low-risk pregnancy and has Supervision of normal first teen pregnancy and Chlamydia infection affecting pregnancy on their problem list.  Patient reports backache.  Contractions: Not present. Vag. Bleeding: None.  Movement: Present. Denies leaking of fluid.   The following portions of the patient's history were reviewed and updated as appropriate: allergies, current medications, past family history, past medical history, past social history, past surgical history and problem list. Problem list updated.  Objective:   Vitals:   03/10/18 0933  BP: (!) 123/58  Pulse: 67  Weight: 126 lb (57.2 kg)    Fetal Status: Fetal Heart Rate (bpm): 151 Fundal Height: 24 cm Movement: Present     General:  Alert, oriented and cooperative. Patient is in no acute distress.  Skin: Skin is warm and dry. No rash noted.   Cardiovascular: Normal heart rate noted  Respiratory: Normal respiratory effort, no problems with respiration noted  Abdomen: Soft, gravid, appropriate for gestational age.  Pain/Pressure: Present     Pelvic: Cervical exam deferred        Extremities: Normal range of motion.  Edema: None  Mental Status: Normal mood and affect. Normal behavior. Normal judgment and thought content.   Assessment and Plan:  Pregnancy: G1P0 at 5736w3d  1. Supervision of normal first pregnancy in second trimester --Feeling well today, c/o intermittent back soreness at end of day --Discussed gentle stretching, sleeping with pillow between knees, elevating legs when sitting --Discussed fasting at next visit for 2 hr GTT  Preterm labor symptoms and general obstetric precautions including but not limited to vaginal bleeding, contractions, leaking of fluid and fetal movement were reviewed in detail with the patient. Please refer  to After Visit Summary for other counseling recommendations.  No follow-ups on file.  No future appointments.  Calvert CantorSamantha C Weinhold, CNM  03/10/18  9:41 AM

## 2018-03-28 ENCOUNTER — Other Ambulatory Visit: Payer: Self-pay

## 2018-03-28 DIAGNOSIS — Z3400 Encounter for supervision of normal first pregnancy, unspecified trimester: Secondary | ICD-10-CM

## 2018-03-31 ENCOUNTER — Other Ambulatory Visit: Payer: Medicaid Other

## 2018-03-31 ENCOUNTER — Other Ambulatory Visit (HOSPITAL_COMMUNITY)
Admission: RE | Admit: 2018-03-31 | Discharge: 2018-03-31 | Disposition: A | Discharge status: Home / Self Care | Payer: Medicaid Other | Source: Ambulatory Visit | Attending: Obstetrics & Gynecology | Admitting: Obstetrics & Gynecology

## 2018-03-31 ENCOUNTER — Ambulatory Visit (INDEPENDENT_AMBULATORY_CARE_PROVIDER_SITE_OTHER): Payer: Medicaid Other | Admitting: Obstetrics & Gynecology

## 2018-03-31 VITALS — BP 120/65 | HR 92 | Wt 133.9 lb

## 2018-03-31 DIAGNOSIS — Z23 Encounter for immunization: Secondary | ICD-10-CM

## 2018-03-31 DIAGNOSIS — Z202 Contact with and (suspected) exposure to infections with a predominantly sexual mode of transmission: Secondary | ICD-10-CM

## 2018-03-31 DIAGNOSIS — Z3400 Encounter for supervision of normal first pregnancy, unspecified trimester: Secondary | ICD-10-CM

## 2018-03-31 DIAGNOSIS — Z3403 Encounter for supervision of normal first pregnancy, third trimester: Secondary | ICD-10-CM | POA: Diagnosis present

## 2018-03-31 NOTE — Patient Instructions (Signed)

## 2018-03-31 NOTE — Addendum Note (Signed)
Addended by: Nicholaus BloomVE, Luisenrique Conran C on: 03/31/2018 10:30 AM   Modules accepted: Orders

## 2018-03-31 NOTE — Progress Notes (Signed)
   PRENATAL VISIT NOTE  Subjective:  Derry LoryJertavia Coupe is a 22 y.o. G1P0 at 655w3d being seen today for ongoing prenatal care.  She is currently monitored for the following issues for this low-risk pregnancy and has Supervision of normal first teen pregnancy and Chlamydia infection affecting pregnancy on their problem list.  Patient reports no complaints.  Contractions: Not present. Vag. Bleeding: None.  Movement: Present. Denies leaking of fluid.   The following portions of the patient's history were reviewed and updated as appropriate: allergies, current medications, past family history, past medical history, past social history, past surgical history and problem list. Problem list updated.  Objective:   Vitals:   03/31/18 1019  BP: 120/65  Pulse: 92  Weight: 133 lb 14.4 oz (60.7 kg)    Fetal Status: Fetal Heart Rate (bpm): 144   Movement: Present     General:  Alert, oriented and cooperative. Patient is in no acute distress.  Skin: Skin is warm and dry. No rash noted.   Cardiovascular: Normal heart rate noted  Respiratory: Normal respiratory effort, no problems with respiration noted  Abdomen: Soft, gravid, appropriate for gestational age.  Pain/Pressure: Absent     Pelvic: Cervical exam deferred        Extremities: Normal range of motion.  Edema: Trace  Mental Status: Normal mood and affect. Normal behavior. Normal judgment and thought content.   Assessment and Plan:  Pregnancy: G1P0 at 685w3d  1. Supervision of normal first teen pregnancy in third trimester - TDAP today - flu vaccine at next visit  Preterm labor symptoms and general obstetric precautions including but not limited to vaginal bleeding, contractions, leaking of fluid and fetal movement were reviewed in detail with the patient. Please refer to After Visit Summary for other counseling recommendations.  No follow-ups on file.  Future Appointments  Date Time Provider Department Center  04/15/2018  9:15 AM Rasch,  Harolyn RutherfordJennifer I, NP [facility]  04/29/2018  9:15 AM Crisoforo OxfordKooistra, Charlesetta GaribaldiKathryn Lorraine, CNM [facility]    Allie BossierMyra C Jarred Purtee, MD

## 2018-04-01 LAB — CBC
HEMATOCRIT: 33.6 % — AB (ref 34.0–46.6)
HEMOGLOBIN: 11.3 g/dL (ref 11.1–15.9)
MCH: 31 pg (ref 26.6–33.0)
MCHC: 33.6 g/dL (ref 31.5–35.7)
MCV: 92 fL (ref 79–97)
Platelets: 168 10*3/uL (ref 150–450)
RBC: 3.65 x10E6/uL — AB (ref 3.77–5.28)
RDW: 13.4 % (ref 12.3–15.4)
WBC: 14.6 10*3/uL — AB (ref 3.4–10.8)

## 2018-04-01 LAB — HIV ANTIBODY (ROUTINE TESTING W REFLEX): HIV SCREEN 4TH GENERATION: NONREACTIVE

## 2018-04-01 LAB — CERVICOVAGINAL ANCILLARY ONLY
Chlamydia: NEGATIVE
NEISSERIA GONORRHEA: NEGATIVE

## 2018-04-01 LAB — RPR: RPR Ser Ql: NONREACTIVE

## 2018-04-01 LAB — GLUCOSE TOLERANCE, 2 HOURS W/ 1HR
GLUCOSE, 2 HOUR: 96 mg/dL (ref 65–152)
Glucose, 1 hour: 101 mg/dL (ref 65–179)
Glucose, Fasting: 78 mg/dL (ref 65–91)

## 2018-04-15 ENCOUNTER — Ambulatory Visit (INDEPENDENT_AMBULATORY_CARE_PROVIDER_SITE_OTHER): Payer: Medicaid Other | Admitting: Obstetrics and Gynecology

## 2018-04-15 ENCOUNTER — Encounter: Payer: Self-pay | Admitting: *Deleted

## 2018-04-15 VITALS — BP 133/74 | HR 74 | Wt 139.1 lb

## 2018-04-15 DIAGNOSIS — A749 Chlamydial infection, unspecified: Secondary | ICD-10-CM

## 2018-04-15 DIAGNOSIS — O98819 Other maternal infectious and parasitic diseases complicating pregnancy, unspecified trimester: Secondary | ICD-10-CM

## 2018-04-15 DIAGNOSIS — O98813 Other maternal infectious and parasitic diseases complicating pregnancy, third trimester: Secondary | ICD-10-CM

## 2018-04-15 DIAGNOSIS — Z3403 Encounter for supervision of normal first pregnancy, third trimester: Secondary | ICD-10-CM | POA: Diagnosis not present

## 2018-04-15 DIAGNOSIS — Z23 Encounter for immunization: Secondary | ICD-10-CM | POA: Diagnosis not present

## 2018-04-15 DIAGNOSIS — Z3400 Encounter for supervision of normal first pregnancy, unspecified trimester: Secondary | ICD-10-CM

## 2018-04-15 NOTE — Patient Instructions (Signed)
Contraception Choices Contraception, also called birth control, refers to methods or devices that prevent pregnancy. Hormonal methods Contraceptive implant A contraceptive implant is a thin, plastic tube that contains a hormone. It is inserted into the upper part of the arm. It can remain in place for up to 3 years. Progestin-only injections Progestin-only injections are injections of progestin, a synthetic form of the hormone progesterone. They are given every 3 months by a health care provider. Birth control pills Birth control pills are pills that contain hormones that prevent pregnancy. They must be taken once a day, preferably at the same time each day. Birth control patch The birth control patch contains hormones that prevent pregnancy. It is placed on the skin and must be changed once a week for three weeks and removed on the fourth week. A prescription is needed to use this method of contraception. Vaginal ring A vaginal ring contains hormones that prevent pregnancy. It is placed in the vagina for three weeks and removed on the fourth week. After that, the process is repeated with a new ring. A prescription is needed to use this method of contraception. Emergency contraceptive Emergency contraceptives prevent pregnancy after unprotected sex. They come in pill form and can be taken up to 5 days after sex. They work best the sooner they are taken after having sex. Most emergency contraceptives are available without a prescription. This method should not be used as your only form of birth control. Barrier methods Female condom A female condom is a thin sheath that is worn over the penis during sex. Condoms keep sperm from going inside a woman's body. They can be used with a spermicide to increase their effectiveness. They should be disposed after a single use. Female condom A female condom is a soft, loose-fitting sheath that is put into the vagina before sex. The condom keeps sperm from going  inside a woman's body. They should be disposed after a single use. Diaphragm A diaphragm is a soft, dome-shaped barrier. It is inserted into the vagina before sex, along with a spermicide. The diaphragm blocks sperm from entering the uterus, and the spermicide kills sperm. A diaphragm should be left in the vagina for 6-8 hours after sex and removed within 24 hours. A diaphragm is prescribed and fitted by a health care provider. A diaphragm should be replaced every 1-2 years, after giving birth, after gaining more than 15 lb (6.8 kg), and after pelvic surgery. Cervical cap A cervical cap is a round, soft latex or plastic cup that fits over the cervix. It is inserted into the vagina before sex, along with spermicide. It blocks sperm from entering the uterus. The cap should be left in place for 6-8 hours after sex and removed within 48 hours. A cervical cap must be prescribed and fitted by a health care provider. It should be replaced every 2 years. Sponge A sponge is a soft, circular piece of polyurethane foam with spermicide on it. The sponge helps block sperm from entering the uterus, and the spermicide kills sperm. To use it, you make it wet and then insert it into the vagina. It should be inserted before sex, left in for at least 6 hours after sex, and removed and thrown away within 30 hours. Spermicides Spermicides are chemicals that kill or block sperm from entering the cervix and uterus. They can come as a cream, jelly, suppository, foam, or tablet. A spermicide should be inserted into the vagina with an applicator at least 10-15 minutes before   sex to allow time for it to work. The process must be repeated every time you have sex. Spermicides do not require a prescription. Intrauterine contraception Intrauterine device (IUD) An IUD is a T-shaped device that is put in a woman's uterus. There are two types:  Hormone IUD.This type contains progestin, a synthetic form of the hormone progesterone. This  type can stay in place for 3-5 years.  Copper IUD.This type is wrapped in copper wire. It can stay in place for 10 years.  Permanent methods of contraception Female tubal ligation In this method, a woman's fallopian tubes are sealed, tied, or blocked during surgery to prevent eggs from traveling to the uterus. Hysteroscopic sterilization In this method, a small, flexible insert is placed into each fallopian tube. The inserts cause scar tissue to form in the fallopian tubes and block them, so sperm cannot reach an egg. The procedure takes about 3 months to be effective. Another form of birth control must be used during those 3 months. Female sterilization This is a procedure to tie off the tubes that carry sperm (vasectomy). After the procedure, the man can still ejaculate fluid (semen). Natural planning methods Natural family planning In this method, a couple does not have sex on days when the woman could become pregnant. Calendar method This means keeping track of the length of each menstrual cycle, identifying the days when pregnancy can happen, and not having sex on those days. Ovulation method In this method, a couple avoids sex during ovulation. Symptothermal method This method involves not having sex during ovulation. The woman typically checks for ovulation by watching changes in her temperature and in the consistency of cervical mucus. Post-ovulation method In this method, a couple waits to have sex until after ovulation. Summary  Contraception, also called birth control, means methods or devices that prevent pregnancy.  Hormonal methods of contraception include implants, injections, pills, patches, vaginal rings, and emergency contraceptives.  Barrier methods of contraception can include female condoms, female condoms, diaphragms, cervical caps, sponges, and spermicides.  There are two types of IUDs (intrauterine devices). An IUD can be put in a woman's uterus to prevent pregnancy  for 3-5 years.  Permanent sterilization can be done through a procedure for males, females, or both.  Natural family planning methods involve not having sex on days when the woman could become pregnant. This information is not intended to replace advice given to you by your health care provider. Make sure you discuss any questions you have with your health care provider. Document Released: 07/23/2005 Document Revised: 08/25/2016 Document Reviewed: 08/25/2016 [employer] Interactive Patient Education  2018 [employer]  

## 2018-04-15 NOTE — Progress Notes (Signed)
   PRENATAL VISIT NOTE  Subjective:  Brittney Wyatt is a 22 y.o. G1P0 at [redacted]w[redacted]d being seen today for ongoing prenatal care.  She is currently monitored for the following issues for this low-risk pregnancy and has Supervision of normal first teen pregnancy and Chlamydia infection affecting pregnancy on their problem list.  Patient reports no complaints.  Contractions: Not present. Vag. Bleeding: None.  Movement: Present. Denies leaking of fluid.   The following portions of the patient's history were reviewed and updated as appropriate: allergies, current medications, past family history, past medical history, past social history, past surgical history and problem list. Problem list updated.  Objective:   Vitals:   04/15/18 1006  BP: 133/74  Pulse: 74  Weight: 139 lb 1.6 oz (63.1 kg)    Fetal Status: Fetal Heart Rate (bpm): 149 Fundal Height: 30 cm Movement: Present     General:  Alert, oriented and cooperative. Patient is in no acute distress.  Skin: Skin is warm and dry. No rash noted.   Cardiovascular: Normal heart rate noted  Respiratory: Normal respiratory effort, no problems with respiration noted  Abdomen: Soft, gravid, appropriate for gestational age.  Pain/Pressure: Absent     Pelvic: Cervical exam deferred        Extremities: Normal range of motion.  Edema: None  Mental Status: Normal mood and affect. Normal behavior. Normal judgment and thought content.   Assessment and Plan:  Pregnancy: G1P0 at [redacted]w[redacted]d  1. Encounter for supervision of normal pregnancy in teen primigravida, antepartum  - Flu Vaccine QUAD 36+ mos IM - Discussed BC options today and GTT results normal.   Preterm labor symptoms and general obstetric precautions including but not limited to vaginal bleeding, contractions, leaking of fluid and fetal movement were reviewed in detail with the patient. Please refer to After Visit Summary for other counseling recommendations.  Return in about 2 weeks (around  04/29/2018).  Future Appointments  Date Time Provider Department Center  04/29/2018  9:15 AM Marylene Land, CNM [facility]    Venia Carbon, NP

## 2018-04-29 ENCOUNTER — Ambulatory Visit (INDEPENDENT_AMBULATORY_CARE_PROVIDER_SITE_OTHER): Payer: Medicaid Other | Admitting: Student

## 2018-04-29 DIAGNOSIS — Z3403 Encounter for supervision of normal first pregnancy, third trimester: Secondary | ICD-10-CM

## 2018-04-29 NOTE — Patient Instructions (Signed)

## 2018-04-29 NOTE — Progress Notes (Signed)
   PRENATAL VISIT NOTE  Subjective:  Brittney Wyatt is a 22 y.o. G1P0 at 6641w4d being seen today for ongoing prenatal care.  She is currently monitored for the following issues for this low-risk pregnancy and has Supervision of normal first teen pregnancy and Chlamydia infection affecting pregnancy on their problem list.  Patient reports no complaints.  Contractions: Irritability. Vag. Bleeding: None.  Movement: Present. Denies leaking of fluid.   The following portions of the patient's history were reviewed and updated as appropriate: allergies, current medications, past family history, past medical history, past social history, past surgical history and problem list. Problem list updated.  Objective:   Vitals:   04/29/18 0953  BP: (!) 127/58  Pulse: 74  Weight: 139 lb (63 kg)    Fetal Status: Fetal Heart Rate (bpm): 143 Fundal Height: 30 cm Movement: Present     General:  Alert, oriented and cooperative. Patient is in no acute distress.  Skin: Skin is warm and dry. No rash noted.   Cardiovascular: Normal heart rate noted  Respiratory: Normal respiratory effort, no problems with respiration noted  Abdomen: Soft, gravid, appropriate for gestational age.  Pain/Pressure: Absent     Pelvic: Cervical exam deferred        Extremities: Normal range of motion.  Edema: None  Mental Status: Normal mood and affect. Normal behavior. Normal judgment and thought content.   Assessment and Plan:  Pregnancy: G1P0 at 2841w4d 1. Supervision of normal pregnancy -Doing well; has decided on Depo.   Preterm labor symptoms and general obstetric precautions including but not limited to vaginal bleeding, contractions, leaking of fluid and fetal movement were reviewed in detail with the patient. Please refer to After Visit Summary for other counseling recommendations.  Return in about 2 weeks (around 05/13/2018), or [facility].  No future appointments.  Marylene LandKathryn Lorraine Ferris Tally, CNM

## 2018-05-07 ENCOUNTER — Inpatient Hospital Stay (HOSPITAL_COMMUNITY)
Admission: AD | Admit: 2018-05-07 | Discharge: 2018-05-08 | Disposition: A | Discharge status: Home / Self Care | Payer: Medicaid Other | Source: Ambulatory Visit | Attending: Obstetrics and Gynecology | Admitting: Obstetrics and Gynecology

## 2018-05-07 ENCOUNTER — Encounter (HOSPITAL_COMMUNITY): Payer: Self-pay

## 2018-05-07 DIAGNOSIS — R030 Elevated blood-pressure reading, without diagnosis of hypertension: Secondary | ICD-10-CM

## 2018-05-07 DIAGNOSIS — R1011 Right upper quadrant pain: Secondary | ICD-10-CM | POA: Diagnosis not present

## 2018-05-07 DIAGNOSIS — O4703 False labor before 37 completed weeks of gestation, third trimester: Secondary | ICD-10-CM | POA: Diagnosis not present

## 2018-05-07 DIAGNOSIS — O26893 Other specified pregnancy related conditions, third trimester: Secondary | ICD-10-CM | POA: Insufficient documentation

## 2018-05-07 DIAGNOSIS — O479 False labor, unspecified: Secondary | ICD-10-CM

## 2018-05-07 DIAGNOSIS — Z7722 Contact with and (suspected) exposure to environmental tobacco smoke (acute) (chronic): Secondary | ICD-10-CM | POA: Insufficient documentation

## 2018-05-07 DIAGNOSIS — O4700 False labor before 37 completed weeks of gestation, unspecified trimester: Secondary | ICD-10-CM

## 2018-05-07 DIAGNOSIS — Z3A33 33 weeks gestation of pregnancy: Secondary | ICD-10-CM | POA: Diagnosis not present

## 2018-05-07 LAB — URINALYSIS, ROUTINE W REFLEX MICROSCOPIC
Bilirubin Urine: NEGATIVE
GLUCOSE, UA: NEGATIVE mg/dL
Hgb urine dipstick: NEGATIVE
Ketones, ur: NEGATIVE mg/dL
LEUKOCYTES UA: NEGATIVE
Nitrite: NEGATIVE
PH: 8 (ref 5.0–8.0)
Protein, ur: NEGATIVE mg/dL
SPECIFIC GRAVITY, URINE: 1.011 (ref 1.005–1.030)

## 2018-05-07 LAB — FETAL FIBRONECTIN: Fetal Fibronectin: NEGATIVE

## 2018-05-07 MED ORDER — BETAMETHASONE SOD PHOS & ACET 6 (3-3) MG/ML IJ SUSP
12.0000 mg | Freq: Once | INTRAMUSCULAR | Status: AC
  Administered 2018-05-08: 12 mg via INTRAMUSCULAR
  Filled 2018-05-07: qty 2

## 2018-05-07 MED ORDER — NIFEDIPINE 10 MG PO CAPS
10.0000 mg | ORAL_CAPSULE | ORAL | Status: AC
  Administered 2018-05-07 – 2018-05-08 (×4): 10 mg via ORAL
  Filled 2018-05-07 (×4): qty 1

## 2018-05-07 NOTE — MAU Note (Addendum)
CTX every 5 minutes-only feels it in her right side.  No LOF.  Some spotting yesterday but none now.  + FM.  No complications with the pregnancy.

## 2018-05-07 NOTE — MAU Note (Signed)
Pt states she has trouble swallowing pills, Wynelle Bourgeois, CNM made aware.

## 2018-05-08 ENCOUNTER — Inpatient Hospital Stay (EMERGENCY_DEPARTMENT_HOSPITAL)
Admission: AD | Admit: 2018-05-08 | Discharge: 2018-05-09 | Disposition: A | Discharge status: Home / Self Care | Payer: Medicaid Other | Source: Ambulatory Visit | Attending: Obstetrics & Gynecology | Admitting: Obstetrics & Gynecology

## 2018-05-08 ENCOUNTER — Inpatient Hospital Stay (HOSPITAL_BASED_OUTPATIENT_CLINIC_OR_DEPARTMENT_OTHER): Payer: Medicaid Other

## 2018-05-08 ENCOUNTER — Inpatient Hospital Stay (HOSPITAL_COMMUNITY)
Admission: AD | Admit: 2018-05-08 | Discharge: 2018-05-08 | Disposition: A | Discharge status: Home / Self Care | Payer: Medicaid Other | Source: Ambulatory Visit | Attending: Family Medicine | Admitting: Family Medicine

## 2018-05-08 DIAGNOSIS — Z3A34 34 weeks gestation of pregnancy: Secondary | ICD-10-CM | POA: Insufficient documentation

## 2018-05-08 DIAGNOSIS — D72829 Elevated white blood cell count, unspecified: Secondary | ICD-10-CM | POA: Insufficient documentation

## 2018-05-08 DIAGNOSIS — Z3A33 33 weeks gestation of pregnancy: Secondary | ICD-10-CM

## 2018-05-08 DIAGNOSIS — Z3403 Encounter for supervision of normal first pregnancy, third trimester: Secondary | ICD-10-CM

## 2018-05-08 DIAGNOSIS — O4703 False labor before 37 completed weeks of gestation, third trimester: Secondary | ICD-10-CM

## 2018-05-08 DIAGNOSIS — O368390 Maternal care for abnormalities of the fetal heart rate or rhythm, unspecified trimester, not applicable or unspecified: Secondary | ICD-10-CM

## 2018-05-08 DIAGNOSIS — Z7722 Contact with and (suspected) exposure to environmental tobacco smoke (acute) (chronic): Secondary | ICD-10-CM | POA: Insufficient documentation

## 2018-05-08 DIAGNOSIS — O26893 Other specified pregnancy related conditions, third trimester: Secondary | ICD-10-CM | POA: Insufficient documentation

## 2018-05-08 DIAGNOSIS — O98813 Other maternal infectious and parasitic diseases complicating pregnancy, third trimester: Secondary | ICD-10-CM

## 2018-05-08 DIAGNOSIS — O99113 Other diseases of the blood and blood-forming organs and certain disorders involving the immune mechanism complicating pregnancy, third trimester: Secondary | ICD-10-CM | POA: Insufficient documentation

## 2018-05-08 DIAGNOSIS — R1011 Right upper quadrant pain: Secondary | ICD-10-CM | POA: Insufficient documentation

## 2018-05-08 DIAGNOSIS — O479 False labor, unspecified: Secondary | ICD-10-CM

## 2018-05-08 DIAGNOSIS — A749 Chlamydial infection, unspecified: Secondary | ICD-10-CM

## 2018-05-08 DIAGNOSIS — O4700 False labor before 37 completed weeks of gestation, unspecified trimester: Secondary | ICD-10-CM

## 2018-05-08 LAB — COMPREHENSIVE METABOLIC PANEL
ALT: 15 U/L (ref 0–44)
ANION GAP: 8 (ref 5–15)
AST: 22 U/L (ref 15–41)
Albumin: 3.2 g/dL — ABNORMAL LOW (ref 3.5–5.0)
Alkaline Phosphatase: 126 U/L (ref 38–126)
BILIRUBIN TOTAL: 0.5 mg/dL (ref 0.3–1.2)
BUN: 8 mg/dL (ref 6–20)
CALCIUM: 9.1 mg/dL (ref 8.9–10.3)
CO2: 22 mmol/L (ref 22–32)
CREATININE: 0.45 mg/dL (ref 0.44–1.00)
Chloride: 103 mmol/L (ref 98–111)
Glucose, Bld: 83 mg/dL (ref 70–99)
Potassium: 3.5 mmol/L (ref 3.5–5.1)
Sodium: 133 mmol/L — ABNORMAL LOW (ref 135–145)
Total Protein: 7.3 g/dL (ref 6.5–8.1)

## 2018-05-08 LAB — CBC
HCT: 29 % — ABNORMAL LOW (ref 36.0–46.0)
HEMATOCRIT: 31.9 % — AB (ref 36.0–46.0)
Hemoglobin: 10.3 g/dL — ABNORMAL LOW (ref 12.0–15.0)
Hemoglobin: 10.8 g/dL — ABNORMAL LOW (ref 12.0–15.0)
MCH: 30.3 pg (ref 26.0–34.0)
MCH: 31.4 pg (ref 26.0–34.0)
MCHC: 33.9 g/dL (ref 30.0–36.0)
MCHC: 35.5 g/dL (ref 30.0–36.0)
MCV: 88.4 fL (ref 78.0–100.0)
MCV: 89.6 fL (ref 78.0–100.0)
PLATELETS: 161 10*3/uL (ref 150–400)
Platelets: 161 10*3/uL (ref 150–400)
RBC: 3.28 MIL/uL — ABNORMAL LOW (ref 3.87–5.11)
RBC: 3.56 MIL/uL — AB (ref 3.87–5.11)
RDW: 12.6 % (ref 11.5–15.5)
RDW: 12.8 % (ref 11.5–15.5)
WBC: 11.3 10*3/uL — AB (ref 4.0–10.5)
WBC: 19.5 10*3/uL — ABNORMAL HIGH (ref 4.0–10.5)

## 2018-05-08 LAB — PROTEIN / CREATININE RATIO, URINE
CREATININE, URINE: 74 mg/dL
Protein Creatinine Ratio: 0.15 mg/mg{Cre} (ref 0.00–0.15)
TOTAL PROTEIN, URINE: 11 mg/dL

## 2018-05-08 LAB — LIPASE, BLOOD: Lipase: 27 U/L (ref 11–51)

## 2018-05-08 MED ORDER — GI COCKTAIL ~~LOC~~
30.0000 mL | Freq: Once | ORAL | Status: AC
  Administered 2018-05-08: 30 mL via ORAL
  Filled 2018-05-08: qty 30

## 2018-05-08 MED ORDER — BETAMETHASONE SOD PHOS & ACET 6 (3-3) MG/ML IJ SUSP
12.0000 mg | Freq: Once | INTRAMUSCULAR | Status: AC
Start: 2018-05-08 — End: 2018-05-08
  Administered 2018-05-08: 12 mg via INTRAMUSCULAR
  Filled 2018-05-08: qty 2

## 2018-05-08 NOTE — MAU Provider Note (Signed)
Chief Complaint:  Contractions   First Provider Initiated Contact with Patient 05/07/18 2213     HPI: Brittney Wyatt is a 22 y.o. G1P0 at 83w6dwho presents to maternity admissions reporting pain in Right upperquadrant with some intermittent contractions which are painful.  Started earlier today.. She reports good fetal movement, denies LOF, vaginal bleeding, vaginal itching/burning, urinary symptoms, h/a, dizziness, n/v, diarrhea, constipation or fever/chills.  She denies headache, visual changes or RUQ abdominal pain.  Abdominal Pain  This is a new problem. The current episode started today. The onset quality is gradual. The problem occurs intermittently. The problem has been unchanged. The pain is located in the RUQ and suprapubic region. The pain is moderate. The quality of the pain is cramping, sharp and dull. The abdominal pain does not radiate. Pertinent negatives include no constipation, diarrhea, dysuria, fever, frequency, myalgias, nausea or vomiting. The pain is aggravated by palpation. The pain is relieved by nothing. She has tried nothing for the symptoms.   RN Note: CTX every 5 minutes-only feels it in her right side.  No LOF.  Some spotting yesterday but none now.  + FM.  No complications with the pregnancy.  Past Medical History: Past Medical History:  Diagnosis Date  . Anemia   . Heart murmur     Past obstetric history: OB History  Gravida Para Term Preterm AB Living  1            SAB TAB Ectopic Multiple Live Births               # Outcome Date GA Lbr Len/2nd Weight Sex Delivery Anes PTL Lv  1 Current             Past Surgical History: Past Surgical History:  Procedure Laterality Date  . NO PAST SURGERIES    . TIBIA FRACTURE SURGERY Right     Family History: Family History  Problem Relation Age of Onset  . Hypertension Mother   . Diabetes Mother     Social History: Social History   Tobacco Use  . Smoking status: Passive Smoke Exposure - Never  Smoker  . Smokeless tobacco: Never Used  Substance Use Topics  . Alcohol use: No  . Drug use: No    Allergies: No Known Allergies  Meds:  Medications Prior to Admission  Medication Sig Dispense Refill Last Dose  . Prenatal Vit-Fe Fumarate-FA (PRENATAL COMPLETE) 14-0.4 MG TABS Take 1 tablet by mouth daily. 60 each 0 Taking    I have reviewed patient's Past Medical Hx, Surgical Hx, Family Hx, Social Hx, medications and allergies.   ROS:  Review of Systems  Constitutional: Negative for fever.  Gastrointestinal: Positive for abdominal pain. Negative for constipation, diarrhea, nausea and vomiting.  Genitourinary: Negative for dysuria and frequency.  Musculoskeletal: Negative for myalgias.   Other systems negative  Physical Exam   Patient Vitals for the past 24 hrs:  BP Temp Pulse Resp SpO2 Height Weight  05/07/18 2354 (!) 144/64 - - - - - -  05/07/18 2326 (!) 141/80 - - - - - -  05/07/18 2252 (!) 138/59 - - - - - -  05/07/18 2133 138/66 98.4 F (36.9 C) 99 19 100 % 5\' 3"  (1.6 m) 64.5 kg   Constitutional: Well-developed, well-nourished female in no acute distress.  Cardiovascular: normal rate and rhythm Respiratory: normal effort, clear to auscultation bilaterally GI: Abd soft, tender over right upper quadrant, gravid appropriate for gestational age.   No rebound or guarding. MS:  Extremities nontender, no edema, normal ROM Neurologic: Alert and oriented x 4.  GU: Neg CVAT.  PELVIC EXAM: Cervix pink, visually closed, without lesion, scant white creamy discharge, vaginal walls and external genitalia normal  Dilation: Closed Effacement (%): 70 Station: 0, -1 Presentation: Vertex Exam by:: Wynelle Bourgeois, CNM   Fetal fibronectin sent  FHT:  Baseline 130 , moderate variability, accelerations present, no decelerations Contractions: q 3-8 mins Irregular     Labs: A/Positive/-- (05/01 0941) Results for orders placed or performed during the hospital encounter of 05/07/18  (from the past 24 hour(s))  Urinalysis, Routine w reflex microscopic     Status: None   Collection Time: 05/07/18  9:51 PM  Result Value Ref Range   Color, Urine YELLOW YELLOW   APPearance CLEAR CLEAR   Specific Gravity, Urine 1.011 1.005 - 1.030   pH 8.0 5.0 - 8.0   Glucose, UA NEGATIVE NEGATIVE mg/dL   Hgb urine dipstick NEGATIVE NEGATIVE   Bilirubin Urine NEGATIVE NEGATIVE   Ketones, ur NEGATIVE NEGATIVE mg/dL   Protein, ur NEGATIVE NEGATIVE mg/dL   Nitrite NEGATIVE NEGATIVE   Leukocytes, UA NEGATIVE NEGATIVE  Protein / creatinine ratio, urine     Status: None   Collection Time: 05/07/18  9:51 PM  Result Value Ref Range   Creatinine, Urine 74.00 mg/dL   Total Protein, Urine 11 mg/dL   Protein Creatinine Ratio 0.15 0.00 - 0.15 mg/mg[Cre]  Fetal fibronectin     Status: None   Collection Time: 05/07/18 10:26 PM  Result Value Ref Range   Fetal Fibronectin NEGATIVE NEGATIVE  CBC     Status: Abnormal   Collection Time: 05/07/18 11:31 PM  Result Value Ref Range   WBC 11.3 (H) 4.0 - 10.5 K/uL   RBC 3.56 (L) 3.87 - 5.11 MIL/uL   Hemoglobin 10.8 (L) 12.0 - 15.0 g/dL   HCT 16.1 (L) 09.6 - 04.5 %   MCV 89.6 78.0 - 100.0 fL   MCH 30.3 26.0 - 34.0 pg   MCHC 33.9 30.0 - 36.0 g/dL   RDW 40.9 81.1 - 91.4 %   Platelets 161 150 - 400 K/uL  Comprehensive metabolic panel     Status: Abnormal   Collection Time: 05/07/18 11:31 PM  Result Value Ref Range   Sodium 133 (L) 135 - 145 mmol/L   Potassium 3.5 3.5 - 5.1 mmol/L   Chloride 103 98 - 111 mmol/L   CO2 22 22 - 32 mmol/L   Glucose, Bld 83 70 - 99 mg/dL   BUN 8 6 - 20 mg/dL   Creatinine, Ser 7.82 0.44 - 1.00 mg/dL   Calcium 9.1 8.9 - 95.6 mg/dL   Total Protein 7.3 6.5 - 8.1 g/dL   Albumin 3.2 (L) 3.5 - 5.0 g/dL   AST 22 15 - 41 U/L   ALT 15 0 - 44 U/L   Alkaline Phosphatase 126 38 - 126 U/L   Total Bilirubin 0.5 0.3 - 1.2 mg/dL   GFR calc non Af Amer >60 >60 mL/min   GFR calc Af Amer >60 >60 mL/min   Anion gap 8 5 - 15  Lipase,  blood     Status: None   Collection Time: 05/07/18 11:31 PM  Result Value Ref Range   Lipase 27 11 - 51 U/L    Imaging:  No results found.  MAU Course/MDM: I have ordered labs and reviewed results. All were negative NST reviewed and is reactive .  Treatments in MAU included Procardia x 2  doses with good resolution of contractions. Still had occasional ones. Fetal fibronectin was negative   Betamethasone given due to significant effacement.  Second one tomorrow  Since she had a few systolic BPs in 140s, labs sent   These were negative for preeclampsia  All diastolics normal.    Given GI cocktail for RUQ pain which was relieved by it.   Assessment: Single intrauterine pregnancy at [redacted]w[redacted]d Preterm uterine contractions with negative fetal fibronectin Borderline systolic hypertension  Plan: Discharge home Preterm Labor precautions and fetal kick counts Follow up in Office for prenatal visits and recheck of status Return tomorrow for Betamethasone and recheck of PTL Encouraged to return here or to other Urgent Care/ED if she develops worsening of symptoms, increase in pain, fever, or other concerning symptoms.  Pt stable at time of discharge.  Wynelle Bourgeois CNM, MSN Certified Nurse-Midwife 05/08/2018 12:11 AM

## 2018-05-08 NOTE — MAU Note (Signed)
CTX every 3-4 minutes since 2145.  No LOF/VB.  + FM.

## 2018-05-08 NOTE — MAU Note (Signed)
FHR baseline 115. Pt moved to side.  Was in MAU last night and baseline was 140s. Maternal HR confirmed via pulse ox and palpation. Provider called to bedside.

## 2018-05-08 NOTE — MAU Note (Signed)
Hogan,CNM notified pt's BMZ administered at approx 0020, per provider it is okay to administer second dose now.

## 2018-05-08 NOTE — MAU Note (Signed)
Pt here for repeat BMZ, denies pain or bleeding. FHT 130's. H. Hogan,CNM notified and will enter order, notified pt denies pain or bleeding.

## 2018-05-08 NOTE — Discharge Instructions (Signed)
Fetal Fibronectin °Fetal fibronectin (fFN) is a protein that your body produces during pregnancy. This protein is normally found in your vaginal fluid in early pregnancy and just before delivery. It should not be there between 22 and 35 weeks of pregnancy. Having fFN in your vagina between 22 and 35 weeks could be a warning sign that your baby will be born early (prematurely). Babies born prematurely, or before 37 weeks, may have trouble breathing or feeding. A negative fFN test between 22 and 35 weeks means that it is unlikely you will have a premature delivery in the next 2 weeks. °You may have this test if you have symptoms of premature labor. These include: °· Contractions. °· Increased vaginal discharge. °· Backache. ° °If there is a chance of preterm labor and delivery, your health care provider will monitor you carefully and take steps to delay your labor if necessary. °This test requires a sample of fluid from inside your vagina. Your health care provider collects this sample using a cotton swab. °How do I prepare for this test? °· Ask your health care provider if: °? You need to avoid using lubricants or douches before this exam. °? You need to avoid sexual intercourse for 24 hours before the exam. °· Tell your health care provider if you have a vaginal yeast infection or any symptoms of a yeast infection: °? Itching. °? Soreness. °? Discharge. °What do the results mean? °It is your responsibility to obtain your test results. Ask the lab or department performing the test when and how you will get your results. Contact your health care provider to discuss any questions you have about your results. °The results of this test will be positive or negative. °Meaning of Negative Test Results °A negative result means no fFN was found in your vaginal fluid. A negative result means that there is very little chance you will go into labor in the next two weeks. You may have this test again in two weeks if you are still  having symptoms of early labor. °Meaning of Positive Test Results °A positive result means fFN was found in your vaginal fluid. A positive result does not mean you will go into early labor. It does mean your risk is greater. Your health care provider may do other tests and exams to closely follow your pregnancy. °Talk with your health care provider to discuss your results, treatment options, and if necessary, the need for more tests. Talk with your health care provider if you have any questions about your results. °This information is not intended to replace advice given to you by your health care provider. Make sure you discuss any questions you have with your health care provider. °Document Released: 05/24/2004 Document Revised: 03/27/2016 Document Reviewed: 10/20/2013 °Elsevier Interactive Patient Education © 2018 [employer] ° ° °Preterm Labor and Birth Information °Pregnancy normally lasts 39-41 weeks. Preterm labor is when labor starts early. It starts before you have been pregnant for 37 whole weeks. °What are the risk factors for preterm labor? °Preterm labor is more likely to occur in women who: °· Have an infection while pregnant. °· Have a cervix that is short. °· Have gone into preterm labor before. °· Have had surgery on their cervix. °· Are younger than age 17. °· Are older than age 35. °· Are African American. °· Are pregnant with two or more babies. °· Take street drugs while pregnant. °· Smoke while pregnant. °· Do not gain enough weight while pregnant. °· Got pregnant   right after another pregnancy. ° °What are the symptoms of preterm labor? °Symptoms of preterm labor include: °· Cramps. The cramps may feel like the cramps some women get during their period. The cramps may happen with watery poop (diarrhea). °· Pain in the belly (abdomen). °· Pain in the lower back. °· Regular contractions or tightening. It may feel like your belly is getting tighter. °· Pressure in the lower belly that seems to  get stronger. °· More fluid (discharge) leaking from the vagina. The fluid may be watery or bloody. °· Water breaking. ° °Why is it important to notice signs of preterm labor? °Babies who are born early may not be fully developed. They have a higher chance for: °· Long-term heart problems. °· Long-term lung problems. °· Trouble controlling body systems, like breathing. °· Bleeding in the brain. °· A condition called cerebral palsy. °· Learning difficulties. °· Death. ° °These risks are highest for babies who are born before 34 weeks of pregnancy. °How is preterm labor treated? °Treatment depends on: °· How long you were pregnant. °· Your condition. °· The health of your baby. ° °Treatment may involve: °· Having a stitch (suture) placed in your cervix. When you give birth, your cervix opens so the baby can come out. The stitch keeps the cervix from opening too soon. °· Staying at the hospital. °· Taking or getting medicines, such as: °? Hormone medicines. °? Medicines to stop contractions. °? Medicines to help the baby’s lungs develop. °? Medicines to prevent your baby from having cerebral palsy. ° °What should I do if I am in preterm labor? °If you think you are going into labor too soon, call your doctor right away. °How can I prevent preterm labor? °· Do not use any tobacco products. °? Examples of these are cigarettes, chewing tobacco, and e-cigarettes. °? If you need help quitting, ask your doctor. °· Do not use street drugs. °· Do not use any medicines unless you ask your doctor if they are safe for you. °· Talk with your doctor before taking any herbal supplements. °· Make sure you gain enough weight. °· Watch for infection. If you think you might have an infection, get it checked right away. °· If you have gone into preterm labor before, tell your doctor. °This information is not intended to replace advice given to you by your health care provider. Make sure you discuss any questions you have with your health  care provider. °Document Released: 10/19/2008 Document Revised: 01/03/2016 Document Reviewed: 12/14/2015 °Elsevier Interactive Patient Education © 2018 [employer] ° °

## 2018-05-09 ENCOUNTER — Inpatient Hospital Stay (HOSPITAL_COMMUNITY): Payer: Medicaid Other

## 2018-05-09 ENCOUNTER — Ambulatory Visit (HOSPITAL_COMMUNITY)
Admission: RE | Admit: 2018-05-09 | Discharge: 2018-05-09 | Disposition: A | Discharge status: Home / Self Care | Payer: Medicaid Other | Source: Ambulatory Visit

## 2018-05-09 DIAGNOSIS — O4703 False labor before 37 completed weeks of gestation, third trimester: Secondary | ICD-10-CM

## 2018-05-09 LAB — COMPREHENSIVE METABOLIC PANEL
ALBUMIN: 3.2 g/dL — AB (ref 3.5–5.0)
ALT: 16 U/L (ref 0–44)
AST: 21 U/L (ref 15–41)
Alkaline Phosphatase: 121 U/L (ref 38–126)
Anion gap: 9 (ref 5–15)
BILIRUBIN TOTAL: 0.4 mg/dL (ref 0.3–1.2)
BUN: 9 mg/dL (ref 6–20)
CO2: 20 mmol/L — ABNORMAL LOW (ref 22–32)
CREATININE: 0.45 mg/dL (ref 0.44–1.00)
Calcium: 9.1 mg/dL (ref 8.9–10.3)
Chloride: 105 mmol/L (ref 98–111)
GFR calc Af Amer: >60 mL/min (ref >60)
GLUCOSE: 118 mg/dL — AB (ref 70–99)
POTASSIUM: 4 mmol/L (ref 3.5–5.1)
Sodium: 134 mmol/L — ABNORMAL LOW (ref 135–145)
TOTAL PROTEIN: 6.7 g/dL (ref 6.5–8.1)

## 2018-05-09 LAB — CBC WITH DIFFERENTIAL/PLATELET
BASOS ABS: 0 10*3/uL (ref 0.0–0.1)
Basophils Relative: 0 %
EOS ABS: 0 10*3/uL (ref 0.0–0.7)
EOS PCT: 0 %
LYMPHS PCT: 6 %
Lymphs Abs: 1.2 10*3/uL (ref 0.7–4.0)
MONO ABS: 0.8 10*3/uL (ref 0.1–1.0)
Monocytes Relative: 4 %
Neutro Abs: 18.3 10*3/uL — ABNORMAL HIGH (ref 1.7–7.7)
Neutrophils Relative %: 90 %

## 2018-05-09 MED ORDER — ONDANSETRON HCL 4 MG/2ML IJ SOLN: 4.0000 mg | Freq: Once | INTRAMUSCULAR | Status: DC

## 2018-05-09 MED ORDER — GI COCKTAIL ~~LOC~~
30.0000 mL | Freq: Once | ORAL | Status: AC
  Administered 2018-05-09: 30 mL via ORAL
  Filled 2018-05-09: qty 30

## 2018-05-09 MED ORDER — LACTATED RINGERS IV SOLN
INTRAVENOUS | Status: DC
  Administered 2018-05-09: 06:00:00 via INTRAVENOUS

## 2018-05-09 MED ORDER — GI COCKTAIL ~~LOC~~: 30.0000 mL | Freq: Once | ORAL | Status: DC

## 2018-05-09 MED ORDER — HYDROMORPHONE HCL 1 MG/ML IJ SOLN
1.0000 mg | Freq: Once | INTRAMUSCULAR | Status: AC
  Administered 2018-05-09: 1 mg via INTRAVENOUS
  Filled 2018-05-09: qty 1

## 2018-05-09 NOTE — Discharge Instructions (Signed)

## 2018-05-09 NOTE — MAU Provider Note (Signed)
Chief Complaint:  Contractions    HPI: Brittney Wyatt is a 22 y.o. G1P0 at 45w0dwho presents to maternity admissions reporting uterine contractions since 2145.  They are more painful than yesterday.  Denies leaking.  Still has severe pain in right upper quadrant.  No vomiting. . She reports good fetal movement, denies LOF, vaginal bleeding, vaginal itching/burning, urinary symptoms, h/a, dizziness, n/v, diarrhea, constipation or fever/chills.  She denies headache, visual changes or RUQ abdominal pain.  Had received betamethasone on 05/08/18, when seen for same RUQ pain and cervical effacement had been noted by me.  Pain subsided with GI cocktail but returned this morning. .  During admission FHR recording at 110-115 x several minutes.  Adjusted monitor and checked patient and FHR returned to normal pattern. ? Artifact vs decel.  Abdominal Pain  This is a recurrent problem. The current episode started today. The problem occurs intermittently. The problem has been unchanged. The pain is located in the RUQ. The pain is severe. The quality of the pain is cramping and sharp. The abdominal pain does not radiate. Pertinent negatives include no constipation, diarrhea, dysuria, fever, frequency or myalgias. The pain is aggravated by palpation. The pain is relieved by nothing. She has tried nothing for the symptoms.   RN Note: CTX every 3-4 minutes since 2145.  No LOF/VB.  + FM.   FHR baseline 115. Pt moved to side.  Was in MAU last night and baseline was 140s. Maternal HR confirmed via pulse ox and palpation. Provider called to bedside.   Past Medical History: Past Medical History:  Diagnosis Date  . Anemia   . Heart murmur     Past obstetric history: OB History  Gravida Para Term Preterm AB Living  1            SAB TAB Ectopic Multiple Live Births               # Outcome Date GA Lbr Len/2nd Weight Sex Delivery Anes PTL Lv  1 Current             Past Surgical History: Past Surgical  History:  Procedure Laterality Date  . NO PAST SURGERIES    . TIBIA FRACTURE SURGERY Right     Family History: Family History  Problem Relation Age of Onset  . Hypertension Mother   . Diabetes Mother     Social History: Social History   Tobacco Use  . Smoking status: Passive Smoke Exposure - Never Smoker  . Smokeless tobacco: Never Used  Substance Use Topics  . Alcohol use: No  . Drug use: No    Allergies: No Known Allergies  Meds:  Medications Prior to Admission  Medication Sig Dispense Refill Last Dose  . Prenatal Vit-Fe Fumarate-FA (PRENATAL COMPLETE) 14-0.4 MG TABS Take 1 tablet by mouth daily. 60 each 0 05/08/2018 at Unknown time    I have reviewed patient's Past Medical Hx, Surgical Hx, Family Hx, Social Hx, medications and allergies.   ROS:  Review of Systems  Constitutional: Negative for fever.  Gastrointestinal: Positive for abdominal pain. Negative for constipation and diarrhea.  Genitourinary: Negative for dysuria and frequency.  Musculoskeletal: Negative for myalgias.   Other systems negative  Physical Exam   Patient Vitals for the past 24 hrs:  BP Temp Temp src Pulse Resp SpO2  05/09/18 0309 (!) 117/40 98.5 F (36.9 C) Oral 78 18 100 %  05/08/18 2330 - - - - - 100 %  05/08/18 2321 - - - - -  99 %  05/08/18 2312 131/69 97.7 F (36.5 C) - 81 19 99 %   Constitutional: Well-developed, well-nourished female in no acute distress.  Cardiovascular: normal rate and rhythm Respiratory: normal effort, clear to auscultation bilaterally GI: Abd soft, quite tender over RUQ, gravid appropriate for gestational age.   No rebound but did have guarding guarding. MS: Extremities nontender, no edema, normal ROM Neurologic: Alert and oriented x 4.  GU: Neg CVAT.  PELVIC EXAM:  Dilation: 1 Effacement (%): 80 Station: -1 Exam by:: Wynelle Bourgeois, CNM  FHT:  Baseline 140 , moderate variability, accelerations present, no decelerations Contractions:  Irregular      Labs: Results for orders placed or performed during the hospital encounter of 05/08/18 (from the past 24 hour(s))  CBC     Status: Abnormal   Collection Time: 05/08/18 11:46 PM  Result Value Ref Range   WBC 19.5 (H) 4.0 - 10.5 K/uL   RBC 3.28 (L) 3.87 - 5.11 MIL/uL   Hemoglobin 10.3 (L) 12.0 - 15.0 g/dL   HCT 16.1 (L) 09.6 - 04.5 %   MCV 88.4 78.0 - 100.0 fL   MCH 31.4 26.0 - 34.0 pg   MCHC 35.5 30.0 - 36.0 g/dL   RDW 40.9 81.1 - 91.4 %   Platelets 161 150 - 400 K/uL  Comprehensive metabolic panel     Status: Abnormal   Collection Time: 05/08/18 11:46 PM  Result Value Ref Range   Sodium 134 (L) 135 - 145 mmol/L   Potassium 4.0 3.5 - 5.1 mmol/L   Chloride 105 98 - 111 mmol/L   CO2 20 (L) 22 - 32 mmol/L   Glucose, Bld 118 (H) 70 - 99 mg/dL   BUN 9 6 - 20 mg/dL   Creatinine, Ser 7.82 0.44 - 1.00 mg/dL   Calcium 9.1 8.9 - 95.6 mg/dL   Total Protein 6.7 6.5 - 8.1 g/dL   Albumin 3.2 (L) 3.5 - 5.0 g/dL   AST 21 15 - 41 U/L   ALT 16 0 - 44 U/L   Alkaline Phosphatase 121 38 - 126 U/L   Total Bilirubin 0.4 0.3 - 1.2 mg/dL   GFR calc non Af Amer >60 >60 mL/min   GFR calc Af Amer >60 >60 mL/min   Anion gap 9 5 - 15  CBC with Differential/Platelet     Status: Abnormal   Collection Time: 05/08/18 11:46 PM  Result Value Ref Range   WBC DUPLICATE 4.0 - 10.5 K/uL   RBC DUPLICATE 3.87 - 5.11 MIL/uL   Hemoglobin DUPLICATE 12.0 - 15.0 g/dL   HCT DUPLICATE 21.3 - 46.0 %   MCV DUPLICATE 78.0 - 100.0 fL   MCH DUPLICATE 26.0 - 34.0 pg   MCHC DUPLICATE 30.0 - 36.0 g/dL   RDW DUPLICATE 08.6 - 15.5 %   Platelets DUPLICATE 150 - 400 K/uL   Neutrophils Relative % 90 %   Neutro Abs 18.3 (H) 1.7 - 7.7 K/uL   Lymphocytes Relative 6 %   Lymphs Abs 1.2 0.7 - 4.0 K/uL   Monocytes Relative 4 %   Monocytes Absolute 0.8 0.1 - 1.0 K/uL   Eosinophils Relative 0 %   Eosinophils Absolute 0.0 0.0 - 0.7 K/uL   Basophils Relative 0 %   Basophils Absolute 0.0 0.0 - 0.1 K/uL   A/Positive/-- (05/01  0941)  Imaging:  Mr Pelvis Wo Contrast  Result Date: 05/09/2018 CLINICAL DATA:  Third trimester pregnancy, right upper quadrant pain with intermittent contractions starting earlier today.  Leukocytosis with left shift. EXAM: MRI ABDOMEN AND PELVIS WITHOUT CONTRAST TECHNIQUE: Multiplanar multisequence MR imaging of the abdomen and pelvis was performed. No intravenous contrast was administered. Today's exam was protocol for maternal assessment COMPARISON:  Abdominal ultrasound 05/09/2018 FINDINGS: COMBINED FINDINGS FOR BOTH MR ABDOMEN AND PELVIS Lower chest: Unremarkable Hepatobiliary: Unremarkable. The gallbladder appears normal. No biliary dilatation. Pancreas:  Unremarkable Spleen: Sharply defined 1.0 by 0.7 cm fluid signal intensity lesion in the spleen Adrenals/Urinary Tract: Adrenal glands normal. Mild right hydronephrosis without overt hydroureter. Ureter passes just behind the somewhat prominent right gonadal vein just below the UPJ. No appreciable focal renal abnormality Stomach/Bowel: Displacement of bowel by the gravid uterus. No dilated bowel or specific bowel abnormality observed. The appendix is not confidently identified but no definite right lower quadrant inflammatory process is observed. Parametrial vasculature and ovary are present in the right lower quadrant just below the cecum. Vascular/Lymphatic:  Unremarkable Reproductive: Single intrauterine pregnancy noted in cephalic presentation, placenta posterior and fundal without previa. The cervix is closed. No appreciable maternal ovarian abnormality. Other:  Unremarkable Musculoskeletal: Unremarkable IMPRESSION: 1. There is mild right hydronephrosis without significant hydroureter, suggesting mild narrowing at the right ureteropelvic junction. Some of this could be related to mass effect from the adjacent gravid uterus. No signal abnormality in the renal parenchyma to suggest pyelonephritis. 2. A specific cause for the patient's leukocytosis is  not identified. Although the appendix is not confidently seen, I do not see a definite right lower quadrant inflammatory process. The gonadal vasculature and ovary are below the cecum. 3. No specific complicating feature related to the fetus is identified. The placenta is posterior and fundal with the fetus in cephalic presentation. 4. A 1.0 cm fluid signal intensity lesion of the spleen is noted without surrounding abnormal signal alterations in the spleen. This is probably a small cyst or possibly a hemangioma, and is unlikely to be clinically consequential. Electronically Signed   By: Gaylyn Rong M.D.   On: 05/09/2018 07:22   Mr Abdomen Wo Contrast  Result Date: 05/09/2018 CLINICAL DATA:  Third trimester pregnancy, right upper quadrant pain with intermittent contractions starting earlier today. Leukocytosis with left shift. EXAM: MRI ABDOMEN AND PELVIS WITHOUT CONTRAST TECHNIQUE: Multiplanar multisequence MR imaging of the abdomen and pelvis was performed. No intravenous contrast was administered. Today's exam was protocol for maternal assessment COMPARISON:  Abdominal ultrasound 05/09/2018 FINDINGS: COMBINED FINDINGS FOR BOTH MR ABDOMEN AND PELVIS Lower chest: Unremarkable Hepatobiliary: Unremarkable. The gallbladder appears normal. No biliary dilatation. Pancreas:  Unremarkable Spleen: Sharply defined 1.0 by 0.7 cm fluid signal intensity lesion in the spleen Adrenals/Urinary Tract: Adrenal glands normal. Mild right hydronephrosis without overt hydroureter. Ureter passes just behind the somewhat prominent right gonadal vein just below the UPJ. No appreciable focal renal abnormality Stomach/Bowel: Displacement of bowel by the gravid uterus. No dilated bowel or specific bowel abnormality observed. The appendix is not confidently identified but no definite right lower quadrant inflammatory process is observed. Parametrial vasculature and ovary are present in the right lower quadrant just below the cecum.  Vascular/Lymphatic:  Unremarkable Reproductive: Single intrauterine pregnancy noted in cephalic presentation, placenta posterior and fundal without previa. The cervix is closed. No appreciable maternal ovarian abnormality. Other:  Unremarkable Musculoskeletal: Unremarkable IMPRESSION: 1. There is mild right hydronephrosis without significant hydroureter, suggesting mild narrowing at the right ureteropelvic junction. Some of this could be related to mass effect from the adjacent gravid uterus. No signal abnormality in the renal parenchyma to suggest pyelonephritis. 2. A specific  cause for the patient's leukocytosis is not identified. Although the appendix is not confidently seen, I do not see a definite right lower quadrant inflammatory process. The gonadal vasculature and ovary are below the cecum. 3. No specific complicating feature related to the fetus is identified. The placenta is posterior and fundal with the fetus in cephalic presentation. 4. A 1.0 cm fluid signal intensity lesion of the spleen is noted without surrounding abnormal signal alterations in the spleen. This is probably a small cyst or possibly a hemangioma, and is unlikely to be clinically consequential. Electronically Signed   By: Gaylyn Rong M.D.   On: 05/09/2018 07:22   [place] Mfm Ob Limited  Result Date: 05/09/2018 ----------------------------------------------------------------------  OBSTETRICS REPORT                       (Signed Final 05/09/2018 08:27 am) ---------------------------------------------------------------------- Patient Info  ID #:       [id number]                          D.O.B.:  1995/12/02 (22 yrs)  Name:       Derry Lory               Visit Date: 05/08/2018 11:48 pm ---------------------------------------------------------------------- Performed By  Performed By:     Hurman Horn          Ref. Address:     [address]                                                             [place],                                                             [place] [address]  Attending:        Noralee Space MD        Location:         [facility]  Referred By:      Armando Reichert CNM ---------------------------------------------------------------------- Orders   #  Description                          Code         Ordered By   1  [place] MFM OB LIMITED                    [phone]     Wynelle Bourgeois  ----------------------------------------------------------------------   #  Order #                    Accession #                 Episode #   1  [id number]  [id number]  ---------------------------------------------------------------------- Indications   Preterm contractions                           O47.00   Abnormal fetal heart rate (decel self-         O76   resolved)   [redacted] weeks gestation of pregnancy                Z3A.33  ---------------------------------------------------------------------- Fetal Evaluation  Num Of Fetuses:         1  Fetal Heart Rate(bpm):  122  Cardiac Activity:       Observed  Presentation:           Cephalic  Placenta:               Posterior  P. Cord Insertion:      Visualized, central  Amniotic Fluid  AFI FV:      Within normal limits  AFI Sum(cm)     %Tile       Largest Pocket(cm)  7.77            4           3.85  RUQ(cm)                     LUQ(cm)        LLQ(cm)  3.85                        1.83           2.09  Comment:    No placental abruption or previa identified. Stomach, bladder,              and diaphragm noted. Movement, breathing, and tone visualized. ---------------------------------------------------------------------- OB History  Gravidity:    1         Term:   0        Prem:   0        SAB:   0  TOP:          0       Ectopic:  0        Living: 0 ---------------------------------------------------------------------- Gestational Age  LMP:           33w 6d        Date:  09/13/17                 EDD:   06/20/18   Best:          33w 6d     Det. By:  LMP  (09/13/17)          EDD:   06/20/18 ---------------------------------------------------------------------- Impression  Patient was evaluated for preterm labor. A limited ultrasound  study was performed.  Amniotic fluid is normal and good fetal  activity is seen. Cephalic presentation. ----------------------------------------------------------------------                  Noralee Space, MD Electronically Signed Final Report   05/09/2018 08:27 am ----------------------------------------------------------------------  US Abdomen Limited Ruq  Result Date: 05/09/2018 CLINICAL DATA:  Thirty-four weeks pregnant, right upper quadrant pain, leukocytosis EXAM: ULTRASOUND ABDOMEN LIMITED RIGHT UPPER QUADRANT COMPARISON:  None. FINDINGS: Gallbladder: No gallstones, gallbladder wall thickening, or pericholecystic fluid. Negative sonographic Murphy's sign. Common bile duct: Diameter: 2 mm Liver: No focal lesion identified.  Within normal limits in parenchymal echogenicity. Portal vein is patent on color Doppler imaging with normal direction of blood flow towards the liver. IMPRESSION: Negative right upper quadrant ultrasound. Electronically Signed   By: Charline Bills M.D.   On: 05/09/2018 01:47      MAU Course/MDM: I have ordered labs and reviewed results.  NST reviewed, reassuring Imaging is negative, leukocytosis thought to be related to the steroids.   Consult Dr Erin Fulling with presentation, exam findings and test results.  Treatments in MAU included analgesia and antiemetics which did provide some relief.    Assessment: 1. Abnormal fetal heart rate affecting pregnancy   2. Chlamydia infection affecting pregnancy in third trimester   3. Leukocytosis   4. RUQ pain   5.     Premature cervical effacement due to preterm contractions  Plan: Discharge home Preterm Labor precautions and fetal kick counts Follow up in Office for prenatal visits and recheck of  cervix  Encouraged to return here or to other Urgent Care/ED if she develops worsening of symptoms, increase in pain, fever, or other concerning symptoms.   Pt stable at time of discharge.  Wynelle Bourgeois CNM, MSN Certified Nurse-Midwife 05/09/2018 5:45 AM

## 2018-05-12 ENCOUNTER — Encounter: Payer: Self-pay | Admitting: Obstetrics and Gynecology

## 2018-05-12 DIAGNOSIS — O133 Gestational [pregnancy-induced] hypertension without significant proteinuria, third trimester: Secondary | ICD-10-CM | POA: Insufficient documentation

## 2018-05-13 ENCOUNTER — Encounter: Payer: Medicaid Other | Admitting: Student

## 2018-05-27 ENCOUNTER — Ambulatory Visit (INDEPENDENT_AMBULATORY_CARE_PROVIDER_SITE_OTHER): Payer: Medicaid Other | Admitting: Student

## 2018-05-27 ENCOUNTER — Other Ambulatory Visit (HOSPITAL_COMMUNITY)
Admission: RE | Admit: 2018-05-27 | Discharge: 2018-05-27 | Disposition: A | Discharge status: Home / Self Care | Payer: Medicaid Other | Source: Ambulatory Visit | Attending: Student | Admitting: Student

## 2018-05-27 VITALS — BP 131/65 | HR 78 | Wt 141.6 lb

## 2018-05-27 DIAGNOSIS — Z3403 Encounter for supervision of normal first pregnancy, third trimester: Secondary | ICD-10-CM | POA: Diagnosis not present

## 2018-05-27 DIAGNOSIS — O26849 Uterine size-date discrepancy, unspecified trimester: Secondary | ICD-10-CM | POA: Insufficient documentation

## 2018-05-27 LAB — OB RESULTS CONSOLE GBS: STREP GROUP B AG: NEGATIVE

## 2018-05-27 LAB — OB RESULTS CONSOLE GC/CHLAMYDIA: Gonorrhea: NEGATIVE

## 2018-05-27 NOTE — Patient Instructions (Signed)
AREA PEDIATRIC/FAMILY PRACTICE PHYSICIANS  [facility] [address], [address] [place], [place]  [address] Phone - [phone]   Fax - [phone]  [facility] [address] [place], [place] [address] Phone - [phone]   Fax - [phone]  [facility] [address] [place], [place]  [address] Phone - [phone]   Fax - [phone]  [facility] [address] [place], [place]  [address] Phone - [phone]   Fax - [phone]  [facility] [address] [place], [place]  [address] Phone - [phone]   Fax - [phone]  [facility] [address] [place], [place]  [address] Phone - [phone]   Fax - [phone]  [facility] [address] [place], [place]  [address] Phone - [phone]   Fax - [phone]  [facility] [address], [address] [place], [place]  [address] Phone - [phone]   Fax - [phone]  [facility] [address] [place], [place]  [address] Phone - [phone]   Fax - [phone] [facility] [address] [place], [place]  [address] Phone - [phone]   Fax - [phone]  [facility] [address] [place], [place]  [address] Phone - [phone]   Fax - [phone]  [facility] [address] [place], [place]  [address] Phone - [phone]   Fax - [phone]  [facility] [address] [place], [place]  [address] Phone - [phone]   Fax - [phone]  [facility] [address] [place], [place]  [address] Phone - [phone]  [facility] [address] [place], [place]  [address] Phone - [phone]   Fax - [phone]  [facility] [address] [place], [place]  [address] Phone - [phone]   Fax - [phone]  [facility] [address] [place], [place]  [address] Phone - [phone]   Fax - [phone]  [facility] [address] [place], [place]  [address] Phone - [phone]   Fax - [phone]   [facility] [address] [place], [place]  [address] Phone - [phone]   Fax - [phone]  [facility] [address] [place], [place]  [address] Phone - [phone]   Fax - [phone]  [facility] [address] [place], [place]  [address] Phone - [phone]   Fax - [phone]  [facility] [address] [place], [place]  [address] Phone - [phone]   Fax - [phone]  [facility] [address] [place], [place]  [address] Phone - [phone]   Fax - [phone] [facility] [address] [place], [place]  [address] Phone - [phone]   Fax - [phone]  [facility] [address] [place], [place]  [address] Phone - [phone]   Fax - [phone]  [facility] [address] [place], [place]  [address] Phone - [phone]   Fax - [phone]  [facility] Charlene Flemming MD [address] [place]  [address] Phone [phone]  Fax [phone]    Braxton Hicks Contractions Contractions of the uterus can occur throughout pregnancy, but they   are not always a sign that you are in labor. You may have practice contractions called Braxton Hicks contractions. These false labor contractions are sometimes confused with true labor. What are Braxton Hicks contractions? Braxton Hicks contractions are tightening movements that occur in the muscles of the uterus before labor. Unlike true labor contractions, these contractions do not result in  opening (dilation) and thinning of the cervix. Toward the end of pregnancy (32-34 weeks), Braxton Hicks contractions can happen more often and may become stronger. These contractions are sometimes difficult to tell apart from true labor because they can be very uncomfortable. You should not feel embarrassed if you go to the hospital with false labor. Sometimes, the only way to tell if you are in true labor is for your health care provider to look for changes in the cervix. The health care provider will do a physical exam and may monitor your contractions. If you are not in true labor, the exam should show that your cervix is not dilating and your water has not broken. If there are other health problems associated with your pregnancy, it is completely safe for you to be sent home with false labor. You may continue to have Braxton Hicks contractions until you go into true labor. How to tell the difference between true labor and false labor True labor  Contractions last 30-70 seconds.  Contractions become very regular.  Discomfort is usually felt in the top of the uterus, and it spreads to the lower abdomen and low back.  Contractions do not go away with walking.  Contractions usually become more intense and increase in frequency.  The cervix dilates and gets thinner. False labor  Contractions are usually shorter and not as strong as true labor contractions.  Contractions are usually irregular.  Contractions are often felt in the front of the lower abdomen and in the groin.  Contractions may go away when you walk around or change positions while lying down.  Contractions get weaker and are shorter-lasting as time goes on.  The cervix usually does not dilate or become thin. Follow these instructions at home:  Take over-the-counter and prescription medicines only as told by your health care provider.  Keep up with your usual exercises and follow other instructions from your health care  provider.  Eat and drink lightly if you think you are going into labor.  If Braxton Hicks contractions are making you uncomfortable: ? Change your position from lying down or resting to walking, or change from walking to resting. ? Sit and rest in a tub of warm water. ? Drink enough fluid to keep your urine pale yellow. Dehydration may cause these contractions. ? Do slow and deep breathing several times an hour.  Keep all follow-up prenatal visits as told by your health care provider. This is important. Contact a health care provider if:  You have a fever.  You have continuous pain in your abdomen. Get help right away if:  Your contractions become stronger, more regular, and closer together.  You have fluid leaking or gushing from your vagina.  You pass blood-tinged mucus (bloody show).  You have bleeding from your vagina.  You have low back pain that you never had before.  You feel your baby's head pushing down and causing pelvic pressure.  Your baby is not moving inside you as much as it used to. Summary  Contractions that occur before labor are called Braxton Hicks contractions, false labor, or practice contractions.  Braxton Hicks contractions are   usually shorter, weaker, farther apart, and less regular than true labor contractions. True labor contractions usually become progressively stronger and regular and they become more frequent.  Manage discomfort from Braxton Hicks contractions by changing position, resting in a warm bath, drinking plenty of water, or practicing deep breathing. This information is not intended to replace advice given to you by your health care provider. Make sure you discuss any questions you have with your health care provider. Document Released: 12/06/2016 Document Revised: 12/06/2016 Document Reviewed: 12/06/2016 Elsevier Interactive Patient Education  2018 [employer]  

## 2018-05-27 NOTE — Progress Notes (Signed)
   PRENATAL VISIT NOTE  Subjective:  Brittney Wyatt is a 22 y.o. G1P0 at [redacted]w[redacted]d being seen today for ongoing prenatal care.  She is currently monitored for the following issues for this low-risk pregnancy and has Supervision of normal first teen pregnancy; Chlamydia infection affecting pregnancy; Transient hypertension of pregnancy in third trimester; and Uterine size date discrepancy on their problem list.  Patient reports no complaints.  Contractions: Not present. Vag. Bleeding: None.  Movement: Present. Denies leaking of fluid.   The following portions of the patient's history were reviewed and updated as appropriate: allergies, current medications, past family history, past medical history, past social history, past surgical history and problem list. Problem list updated.  Objective:   Vitals:   05/27/18 1317  BP: 131/65  Pulse: 78  Weight: 141 lb 9.6 oz (64.2 kg)    Fetal Status: Fetal Heart Rate (bpm): 135 Fundal Height: 33 cm Movement: Present  Presentation: Vertex  General:  Alert, oriented and cooperative. Patient is in no acute distress.  Skin: Skin is warm and dry. No rash noted.   Cardiovascular: Normal heart rate noted  Respiratory: Normal respiratory effort, no problems with respiration noted  Abdomen: Soft, gravid, appropriate for gestational age.  Pain/Pressure: Absent     Pelvic: Cervical exam deferred        Extremities: Normal range of motion.  Edema: None  Mental Status: Normal mood and affect. Normal behavior. Normal judgment and thought content.   Assessment and Plan:  Pregnancy: G1P0 at [redacted]w[redacted]d  1. Supervision of normal first teen pregnancy in third trimester -Discussed peds and depo - Culture, beta strep (group b only) - GC/Chlamydia probe amp ([place])not at [facility] -BP normal today  Term labor symptoms and general obstetric precautions including but not limited to vaginal bleeding, contractions, leaking of fluid and fetal movement were reviewed in  detail with the patient. Please refer to After Visit Summary for other counseling recommendations.  No follow-ups on file.  Future Appointments  Date Time Provider Department Center  05/29/2018  2:30 PM [facility] [place] 1 [facility]    Marylene Land, CNM

## 2018-05-28 LAB — GC/CHLAMYDIA PROBE AMP (~~LOC~~) NOT AT ARMC
Chlamydia: NEGATIVE
Neisseria Gonorrhea: NEGATIVE

## 2018-05-29 ENCOUNTER — Other Ambulatory Visit: Payer: Self-pay | Admitting: Student

## 2018-05-29 ENCOUNTER — Ambulatory Visit (HOSPITAL_COMMUNITY)
Admission: RE | Admit: 2018-05-29 | Discharge: 2018-05-29 | Disposition: A | Discharge status: Home / Self Care | Payer: Medicaid Other | Source: Ambulatory Visit | Attending: Student | Admitting: Student

## 2018-05-29 DIAGNOSIS — Z3403 Encounter for supervision of normal first pregnancy, third trimester: Secondary | ICD-10-CM

## 2018-05-29 DIAGNOSIS — O26843 Uterine size-date discrepancy, third trimester: Secondary | ICD-10-CM | POA: Diagnosis not present

## 2018-05-29 DIAGNOSIS — Z3A36 36 weeks gestation of pregnancy: Secondary | ICD-10-CM | POA: Diagnosis not present

## 2018-05-30 LAB — CULTURE, BETA STREP (GROUP B ONLY): STREP GP B CULTURE: NEGATIVE

## 2018-06-02 ENCOUNTER — Inpatient Hospital Stay (HOSPITAL_COMMUNITY)
Admission: AD | Admit: 2018-06-02 | Discharge: 2018-06-02 | Disposition: A | Discharge status: Home / Self Care | Payer: Medicaid Other | Source: Ambulatory Visit | Attending: Obstetrics & Gynecology | Admitting: Obstetrics & Gynecology

## 2018-06-02 DIAGNOSIS — Z7722 Contact with and (suspected) exposure to environmental tobacco smoke (acute) (chronic): Secondary | ICD-10-CM | POA: Insufficient documentation

## 2018-06-02 DIAGNOSIS — Z3A37 37 weeks gestation of pregnancy: Secondary | ICD-10-CM | POA: Insufficient documentation

## 2018-06-02 DIAGNOSIS — Z8249 Family history of ischemic heart disease and other diseases of the circulatory system: Secondary | ICD-10-CM | POA: Insufficient documentation

## 2018-06-02 DIAGNOSIS — N898 Other specified noninflammatory disorders of vagina: Secondary | ICD-10-CM

## 2018-06-02 DIAGNOSIS — O4693 Antepartum hemorrhage, unspecified, third trimester: Secondary | ICD-10-CM | POA: Diagnosis present

## 2018-06-02 LAB — WET PREP, GENITAL
Clue Cells Wet Prep HPF POC: NONE SEEN
SPERM: NONE SEEN
TRICH WET PREP: NONE SEEN
Yeast Wet Prep HPF POC: NONE SEEN

## 2018-06-02 NOTE — MAU Provider Note (Addendum)
History   Patient Brittney Wyatt is a 22 y.o. G1P0 At [redacted]w[redacted]d here with complaints of vaginal bleeding today and LOF. She denies decreased fetal movements, HA, blurry vision, back ache, RUQ.   Pregnancy has been complicated by transient hypertension with normal labs; FH measured small but [place] on 10-24 infant weighed 6 lbs.   CSN: [id number]  Arrival date and time: 06/02/18 4098   None     Chief Complaint  Patient presents with  . Vaginal Bleeding   Vaginal Bleeding  The patient's primary symptoms include vaginal bleeding. The current episode started in the past 7 days. The problem occurs rarely (She woke up yesterday morning with bright red blood on her underwear; she wore a pad during the day but didn't have any more bleeding. Then, she had some pink spotting today when she went to the bathroom at 5 am. Has not had any bleeding since this morning).    OB History    Gravida  1   Para      Term      Preterm      AB      Living        SAB      TAB      Ectopic      Multiple      Live Births              Past Medical History:  Diagnosis Date  . Anemia   . Heart murmur     Past Surgical History:  Procedure Laterality Date  . NO PAST SURGERIES    . TIBIA FRACTURE SURGERY Right     Family History  Problem Relation Age of Onset  . Hypertension Mother   . Diabetes Mother     Social History   Tobacco Use  . Smoking status: Passive Smoke Exposure - Never Smoker  . Smokeless tobacco: Never Used  Substance Use Topics  . Alcohol use: No  . Drug use: No    Allergies: No Known Allergies  Medications Prior to Admission  Medication Sig Dispense Refill Last Dose  . Prenatal Vit-Fe Fumarate-FA (PRENATAL COMPLETE) 14-0.4 MG TABS Take 1 tablet by mouth daily. 60 each 0 Taking    Review of Systems  Constitutional: Negative.   HENT: Negative.   Respiratory: Negative.   Cardiovascular: Negative.   Gastrointestinal: Negative.   Genitourinary:  Positive for vaginal bleeding.   Physical Exam   Blood pressure 123/83, pulse 89, temperature (!) 97.4 F (36.3 C), resp. rate 19, height 5\' 3"  (1.6 m), weight 66.7 kg, last menstrual period 09/13/2017.  Physical Exam  Constitutional: She is oriented to person, place, and time. She appears well-developed.  HENT:  Head: Normocephalic.  Neck: Normal range of motion.  GI: Soft.  Genitourinary:  Genitourinary Comments: NEFG; no blood in vaginal vault. Copious white discharge; no pooling. Cervix is 1 cm, middle position, 50%. -3 station.   Musculoskeletal: Normal range of motion.  Neurological: She is alert and oriented to person, place, and time.  Skin: Skin is warm and dry.  Psychiatric: She has a normal mood and affect.    MAU Course  Procedures  MDM -NST: 125 bpm mod var, present acel, neg decels. CTX q 10 min, patient very comfortable with contractions. She does not feel her contractions, she just feels "pressure down there".  No active bleeding since 5 am; no bleeding while in MAU.  -wet prep negative, GC Chlamydia deferred as patient was tested at 36 week  labs.   Assessment and Plan   1. Vaginal discharge    2. Patient stable for discharge with return precautions reviewed (bleeding, LOF, decreased fetal movements).   3. Keep appt on on 06-04-2018 with ob-gyn provider.   Charlesetta Garibaldi Fletcher Ostermiller 06/02/2018, 7:51 AM

## 2018-06-02 NOTE — Discharge Instructions (Signed)
Vaginal Delivery Vaginal delivery means that you will give birth by pushing your baby out of your birth canal (vagina). A team of health care providers will help you before, during, and after vaginal delivery. Birth experiences are unique for every woman and every pregnancy, and birth experiences vary depending on where you choose to give birth. What should I do to prepare for my baby's birth? Before your baby is born, it is important to talk with your health care provider about:  Your labor and delivery preferences. These may include: ? Medicines that you may be given. ? How you will manage your pain. This might include non-medical pain relief techniques or injectable pain relief such as epidural analgesia. ? How you and your baby will be monitored during labor and delivery. ? Who may be in the labor and delivery room with you. ? Your feelings about surgical delivery of your baby (cesarean delivery, or C-section) if this becomes necessary. ? Your feelings about receiving donated blood through an IV tube (blood transfusion) if this becomes necessary.  Whether you are able: ? To take pictures or videos of the birth. ? To eat during labor and delivery. ? To move around, walk, or change positions during labor and delivery.  What to expect after your baby is born, such as: ? Whether delayed umbilical cord clamping and cutting is offered. ? Who will care for your baby right after birth. ? Medicines or tests that may be recommended for your baby. ? Whether breastfeeding is supported in your hospital or birth center. ? How long you will be in the hospital or birth center.  How any medical conditions you have may affect your baby or your labor and delivery experience.  To prepare for your baby's birth, you should also:  Attend all of your health care visits before delivery (prenatal visits) as recommended by your health care provider. This is important.  Prepare your home for your baby's  arrival. Make sure that you have: ? Diapers. ? Baby clothing. ? Feeding equipment. ? Safe sleeping arrangements for you and your baby.  Install a car seat in your vehicle. Have your car seat checked by a certified car seat installer to make sure that it is installed safely.  Think about who will help you with your new baby at home for at least the first several weeks after delivery.  What can I expect when I arrive at the birth center or hospital? Once you are in labor and have been admitted into the hospital or birth center, your health care provider may:  Review your pregnancy history and any concerns you have.  Insert an IV tube into one of your veins. This is used to give you fluids and medicines.  Check your blood pressure, pulse, temperature, and heart rate (vital signs).  Check whether your bag of water (amniotic sac) has broken (ruptured).  Talk with you about your birth plan and discuss pain control options.  Monitoring Your health care provider may monitor your contractions (uterine monitoring) and your baby's heart rate (fetal monitoring). You may need to be monitored:  Often, but not continuously (intermittently).  All the time or for long periods at a time (continuously). Continuous monitoring may be needed if: ? You are taking certain medicines, such as medicine to relieve pain or make your contractions stronger. ? You have pregnancy or labor complications.  Monitoring may be done by:  Placing a special stethoscope or a handheld monitoring device on your abdomen to   check your baby's heartbeat, and feeling your abdomen for contractions. This method of monitoring does not continuously record your baby's heartbeat or your contractions.  Placing monitors on your abdomen (external monitors) to record your baby's heartbeat and the frequency and length of contractions. You may not have to wear external monitors all the time.  Placing monitors inside of your uterus  (internal monitors) to record your baby's heartbeat and the frequency, length, and strength of your contractions. ? Your health care provider may use internal monitors if he or she needs more information about the strength of your contractions or your baby's heart rate. ? Internal monitors are put in place by passing a thin, flexible wire through your vagina and into your uterus. Depending on the type of monitor, it may remain in your uterus or on your baby's head until birth. ? Your health care provider will discuss the benefits and risks of internal monitoring with you and will ask for your permission before inserting the monitors.  Telemetry. This is a type of continuous monitoring that can be done with external or internal monitors. Instead of having to stay in bed, you are able to move around during telemetry. Ask your health care provider if telemetry is an option for you.  Physical exam Your health care provider may perform a physical exam. This may include:  Checking whether your baby is positioned: ? With the head toward your vagina (head-down). This is most common. ? With the head toward the top of your uterus (head-up or breech). If your baby is in a breech position, your health care provider may try to turn your baby to a head-down position so you can deliver vaginally. If it does not seem that your baby can be born vaginally, your provider may recommend surgery to deliver your baby. In rare cases, you may be able to deliver vaginally if your baby is head-up (breech delivery). ? Lying sideways (transverse). Babies that are lying sideways cannot be delivered vaginally.  Checking your cervix to determine: ? Whether it is thinning out (effacing). ? Whether it is opening up (dilating). ? How low your baby has moved into your birth canal.  What are the three stages of labor and delivery?  Normal labor and delivery is divided into the following three stages: Stage 1  Stage 1 is the  longest stage of labor, and it can last for hours or days. Stage 1 includes: ? Early labor. This is when contractions may be irregular, or regular and mild. Generally, early labor contractions are more than 10 minutes apart. ? Active labor. This is when contractions get longer, more regular, more frequent, and more intense. ? The transition phase. This is when contractions happen very close together, are very intense, and may last longer than during any other part of labor.  Contractions generally feel mild, infrequent, and irregular at first. They get stronger, more frequent (about every 2-3 minutes), and more regular as you progress from early labor through active labor and transition.  Many women progress through stage 1 naturally, but you may need help to continue making progress. If this happens, your health care provider may talk with you about: ? Rupturing your amniotic sac if it has not ruptured yet. ? Giving you medicine to help make your contractions stronger and more frequent.  Stage 1 ends when your cervix is completely dilated to 4 inches (10 cm) and completely effaced. This happens at the end of the transition phase. Stage 2  Once   your cervix is completely effaced and dilated to 4 inches (10 cm), you may start to feel an urge to push. It is common for the body to naturally take a rest before feeling the urge to push, especially if you received an epidural or certain other pain medicines. This rest period may last for up to 1-2 hours, depending on your unique labor experience.  During stage 2, contractions are generally less painful, because pushing helps relieve contraction pain. Instead of contraction pain, you may feel stretching and burning pain, especially when the widest part of your baby's head passes through the vaginal opening (crowning).  Your health care provider will closely monitor your pushing progress and your baby's progress through the vagina during stage 2.  Your  health care provider may massage the area of skin between your vaginal opening and anus (perineum) or apply warm compresses to your perineum. This helps it stretch as the baby's head starts to crown, which can help prevent perineal tearing. ? In some cases, an incision may be made in your perineum (episiotomy) to allow the baby to pass through the vaginal opening. An episiotomy helps to make the opening of the vagina larger to allow more room for the baby to fit through.  It is very important to breathe and focus so your health care provider can control the delivery of your baby's head. Your health care provider may have you decrease the intensity of your pushing, to help prevent perineal tearing.  After delivery of your baby's head, the shoulders and the rest of the body generally deliver very quickly and without difficulty.  Once your baby is delivered, the umbilical cord may be cut right away, or this may be delayed for 1-2 minutes, depending on your baby's health. This may vary among health care providers, hospitals, and birth centers.  If you and your baby are healthy enough, your baby may be placed on your chest or abdomen to help maintain the baby's temperature and to help you bond with each other. Some mothers and babies start breastfeeding at this time. Your health care team will dry your baby and help keep your baby warm during this time.  Your baby may need immediate care if he or she: ? Showed signs of distress during labor. ? Has a medical condition. ? Was born too early (prematurely). ? Had a bowel movement before birth (meconium). ? Shows signs of difficulty transitioning from being inside the uterus to being outside of the uterus. If you are planning to breastfeed, your health care team will help you begin a feeding. Stage 3  The third stage of labor starts immediately after the birth of your baby and ends after you deliver the placenta. The placenta is an organ that develops  during pregnancy to provide oxygen and nutrients to your baby in the womb.  Delivering the placenta may require some pushing, and you may have mild contractions. Breastfeeding can stimulate contractions to help you deliver the placenta.  After the placenta is delivered, your uterus should tighten (contract) and become firm. This helps to stop bleeding in your uterus. To help your uterus contract and to control bleeding, your health care provider may: ? Give you medicine by injection, through an IV tube, by mouth, or through your rectum (rectally). ? Massage your abdomen or perform a vaginal exam to remove any blood clots that are left in your uterus. ? Empty your bladder by placing a thin, flexible tube (catheter) into your bladder. ? Encourage   you to breastfeed your baby. After labor is over, you and your baby will be monitored closely to ensure that you are both healthy until you are ready to go home. Your health care team will teach you how to care for yourself and your baby. This information is not intended to replace advice given to you by your health care provider. Make sure you discuss any questions you have with your health care provider. Document Released: 05/01/2008 Document Revised: 02/10/2016 Document Reviewed: 08/07/2015 Elsevier Interactive Patient Education  2018 [employer]vier Inc[employer]  

## 2018-06-02 NOTE — MAU Note (Signed)
Had some bleeding when she lost her mucus plug yesterday morning-has continued since.  Not wearing a pad.  No LOF.  + FM.  Reports some mild cramping.

## 2018-06-03 ENCOUNTER — Encounter: Payer: Self-pay | Admitting: Student

## 2018-06-04 ENCOUNTER — Ambulatory Visit (INDEPENDENT_AMBULATORY_CARE_PROVIDER_SITE_OTHER): Payer: Medicaid Other | Admitting: Student

## 2018-06-04 VITALS — BP 127/60 | HR 76

## 2018-06-04 DIAGNOSIS — Z3403 Encounter for supervision of normal first pregnancy, third trimester: Secondary | ICD-10-CM

## 2018-06-04 NOTE — Progress Notes (Signed)
   PRENATAL VISIT NOTE  Subjective:  Brittney Wyatt is a 22 y.o. G1P0 at [redacted]w[redacted]d being seen today for ongoing prenatal care.  She is currently monitored for the following issues for this low-risk pregnancy and has Supervision of normal first teen pregnancy; Chlamydia infection affecting pregnancy; Transient hypertension of pregnancy in third trimester; Uterine size date discrepancy; Acne; and Eczema on their problem list.  Patient reports no complaints.  Contractions: Not present. Vag. Bleeding: None.  Movement: Present. Denies leaking of fluid.  Was seen in MAU Sunday for vaginal bleeding. Denies vaginal bleeding since then.   The following portions of the patient's history were reviewed and updated as appropriate: allergies, current medications, past family history, past medical history, past social history, past surgical history and problem list. Problem list updated.  Objective:   Vitals:   06/04/18 1641  BP: 127/60  Pulse: 76    Fetal Status: Fetal Heart Rate (bpm): 136 Fundal Height: 36 cm Movement: Present     General:  Alert, oriented and cooperative. Patient is in no acute distress.  Skin: Skin is warm and dry. No rash noted.   Cardiovascular: Normal heart rate noted  Respiratory: Normal respiratory effort, no problems with respiration noted  Abdomen: Soft, gravid, appropriate for gestational age.  Pain/Pressure: Absent     Pelvic: Cervical exam deferred        Extremities: Normal range of motion.  Edema: None  Mental Status: Normal mood and affect. Normal behavior. Normal judgment and thought content.   Assessment and Plan:  Pregnancy: G1P0 at [redacted]w[redacted]d  1. Supervision of normal first teen pregnancy in third trimester -doing well -waiting to hear back from pediatrician, will let us know which one at next visit.   Term labor symptoms and general obstetric precautions including but not limited to vaginal bleeding, contractions, leaking of fluid and fetal movement were  reviewed in detail with the patient. Please refer to After Visit Summary for other counseling recommendations.  Return in about 1 week (around 06/11/2018) for Routine OB.  Future Appointments  Date Time Provider Department Center  06/11/2018  2:15 PM Currie Paris, NP [facility]  06/18/2018  1:15 PM Marvetta Gibbons Brand Males, NP [facility]  06/25/2018  1:15 PM Burleson, Brand Males, NP [facility]    Judeth Horn, NP

## 2018-06-04 NOTE — Patient Instructions (Signed)
Before Baby Comes Home  Before your baby arrives it is important to:   Have all of the supplies that you will need to care for your baby.   Know where to go if there is an emergency.   Discuss the baby's arrival with other family members.    What supplies will I need?    It is recommended that you have the following supplies:  Large Items   Crib.   Crib mattress.   Rear-facing infant car seat. If possible, have a trained professional check to make sure that it is installed correctly.    Feeding   6-8 bottles that are 4-5 oz in size.   6-8 nipples.   Bottle brush.   Sterilizer, or a large pan or kettle with a lid.   A way to boil and cool water.   If you will be breastfeeding:  ? Breast pump.  ? Nipple cream.  ? Nursing bra.  ? Breast pads.  ? Breast shields.   If you will be formula feeding:  ? Formula.  ? Measuring cups.  ? Measuring spoons.    Bathing   Mild baby soap and baby shampoo.   Petroleum jelly.   Soft cloth towel and washcloth.   Hooded towel.   Cotton balls.   Bath basin.    Other Supplies   Rectal thermometer.   Bulb syringe.   Baby wipes or washcloths for diaper changes.   Diaper bag.   Changing pad.   Clothing, including one-piece outfits and pajamas.   Baby nail clippers.   Receiving blankets.   Mattress pad and sheets for the crib.   Night-light for the baby's room.   Baby monitor.   2 or 3 pacifiers.   Either 24-36 cloth diapers and waterproof diaper covers or a box of disposable diapers. You may need to use as many as 10-12 diapers per day.    How do I prepare for an emergency?  Prepare for an emergency by:   Knowing how to get to the nearest hospital.   Listing the phone numbers of your baby's health care providers near your home phone and in your cell phone.    How do I prepare my family?   Decide how to handle visitors.   If you have other children:  ? Talk with them about the baby coming home. Ask them how they feel about it.  ? Read a book together about  being a new big brother or sister.  ? Find ways to let them help you prepare for the new baby.  ? Have someone ready to care for them while you are in the hospital.  This information is not intended to replace advice given to you by your health care provider. Make sure you discuss any questions you have with your health care provider.  Document Released: 07/05/2008 Document Revised: 12/29/2015 Document Reviewed: 06/30/2014  Elsevier Interactive Patient Education  2018 [employer]

## 2018-06-11 ENCOUNTER — Ambulatory Visit (INDEPENDENT_AMBULATORY_CARE_PROVIDER_SITE_OTHER): Payer: Medicaid Other | Admitting: Nurse Practitioner

## 2018-06-11 ENCOUNTER — Encounter: Payer: Self-pay | Admitting: Nurse Practitioner

## 2018-06-11 VITALS — BP 133/80 | HR 99 | Wt 149.0 lb

## 2018-06-11 DIAGNOSIS — Z3403 Encounter for supervision of normal first pregnancy, third trimester: Secondary | ICD-10-CM

## 2018-06-11 DIAGNOSIS — Z3400 Encounter for supervision of normal first pregnancy, unspecified trimester: Secondary | ICD-10-CM

## 2018-06-11 LAB — POCT URINALYSIS DIP (DEVICE)
Bilirubin Urine: NEGATIVE
Glucose, UA: NEGATIVE mg/dL
HGB URINE DIPSTICK: NEGATIVE
Leukocytes, UA: NEGATIVE
Nitrite: NEGATIVE
PH: 7 (ref 5.0–8.0)
PROTEIN: NEGATIVE mg/dL
SPECIFIC GRAVITY, URINE: 1.025 (ref 1.005–1.030)
Urobilinogen, UA: 1 mg/dL (ref 0.0–1.0)

## 2018-06-11 NOTE — Progress Notes (Signed)
    Subjective:  Brittney Wyatt is a 22 y.o. G1P0 at [redacted]w[redacted]d being seen today for ongoing prenatal care.  She is currently monitored for the following issues for this low-risk pregnancy and has Supervision of normal first teen pregnancy; Chlamydia infection affecting pregnancy; Transient hypertension of pregnancy in third trimester; Uterine size date discrepancy; Acne; and Eczema on their problem list.  Patient reports no complaints.  Contractions: Not present. Vag. Bleeding: None.  Movement: Present. Denies leaking of fluid.   The following portions of the patient's history were reviewed and updated as appropriate: allergies, current medications, past family history, past medical history, past social history, past surgical history and problem list. Problem list updated.  Objective:   Vitals:   06/11/18 1458  BP: 136/67  Pulse: 90  Weight: 149 lb (67.6 kg)    Fetal Status: Fetal Heart Rate (bpm): 142 Fundal Height: 36 cm Movement: Present     General:  Alert, oriented and cooperative. Patient is in no acute distress.  Skin: Skin is warm and dry. No rash noted.   Cardiovascular: Normal heart rate noted  Respiratory: Normal respiratory effort, no problems with respiration noted  Abdomen: Soft, gravid, appropriate for gestational age. Pain/Pressure: Absent     Pelvic:  Cervical exam deferred        Extremities: Normal range of motion.  Edema: None  Mental Status: Normal mood and affect. Normal behavior. Normal judgment and thought content.   Urinalysis:    negative for protein  Assessment and Plan:  Pregnancy: G1P0 at [redacted]w[redacted]d  1. Encounter for supervision of normal pregnancy in teen primigravida, antepartum Has headache, but no visual changes, no edema but slight RUQ pain - will do urinalysis and evaluate for proteinuria - negative today. Advised to return to MAU if headache worsens, if she develops worsening RUQ pain or edema or visual changes.  Term labor symptoms and general  obstetric precautions including but not limited to vaginal bleeding, contractions, leaking of fluid and fetal movement were reviewed in detail with the patient. Please refer to After Visit Summary for other counseling recommendations.  Return in about 1 week (around 06/18/2018).  Nolene Bernheim, RN, MSN, NP-BC Nurse Practitioner, [facility] for [employer], [facility] 06/11/2018 3:10 PM

## 2018-06-18 ENCOUNTER — Encounter (HOSPITAL_COMMUNITY): Payer: Self-pay | Admitting: *Deleted

## 2018-06-18 ENCOUNTER — Inpatient Hospital Stay (HOSPITAL_COMMUNITY)
Admission: AD | Admit: 2018-06-18 | Discharge: 2018-06-21 | DRG: 807 | Disposition: A | Discharge status: Home / Self Care | Payer: Medicaid Other | Attending: Obstetrics and Gynecology | Admitting: Obstetrics and Gynecology

## 2018-06-18 ENCOUNTER — Ambulatory Visit (INDEPENDENT_AMBULATORY_CARE_PROVIDER_SITE_OTHER): Payer: Medicaid Other | Admitting: Nurse Practitioner

## 2018-06-18 VITALS — BP 158/88 | HR 99 | Wt 147.0 lb

## 2018-06-18 DIAGNOSIS — A749 Chlamydial infection, unspecified: Secondary | ICD-10-CM

## 2018-06-18 DIAGNOSIS — Z3A39 39 weeks gestation of pregnancy: Secondary | ICD-10-CM | POA: Diagnosis not present

## 2018-06-18 DIAGNOSIS — Z7722 Contact with and (suspected) exposure to environmental tobacco smoke (acute) (chronic): Secondary | ICD-10-CM | POA: Diagnosis present

## 2018-06-18 DIAGNOSIS — Z3403 Encounter for supervision of normal first pregnancy, third trimester: Secondary | ICD-10-CM

## 2018-06-18 DIAGNOSIS — O139 Gestational [pregnancy-induced] hypertension without significant proteinuria, unspecified trimester: Secondary | ICD-10-CM | POA: Diagnosis present

## 2018-06-18 DIAGNOSIS — O98813 Other maternal infectious and parasitic diseases complicating pregnancy, third trimester: Secondary | ICD-10-CM

## 2018-06-18 DIAGNOSIS — O134 Gestational [pregnancy-induced] hypertension without significant proteinuria, complicating childbirth: Secondary | ICD-10-CM | POA: Diagnosis present

## 2018-06-18 DIAGNOSIS — R030 Elevated blood-pressure reading, without diagnosis of hypertension: Secondary | ICD-10-CM

## 2018-06-18 LAB — COMPREHENSIVE METABOLIC PANEL
ALT: 19 U/L (ref 0–44)
AST: 26 U/L (ref 15–41)
Albumin: 2.7 g/dL — ABNORMAL LOW (ref 3.5–5.0)
Alkaline Phosphatase: 188 U/L — ABNORMAL HIGH (ref 38–126)
Anion gap: 8 (ref 5–15)
BUN: 9 mg/dL (ref 6–20)
CALCIUM: 8.9 mg/dL (ref 8.9–10.3)
CO2: 20 mmol/L — AB (ref 22–32)
Chloride: 107 mmol/L (ref 98–111)
Creatinine, Ser: 0.56 mg/dL (ref 0.44–1.00)
Glucose, Bld: 84 mg/dL (ref 70–99)
Potassium: 3.7 mmol/L (ref 3.5–5.1)
SODIUM: 135 mmol/L (ref 135–145)
Total Bilirubin: <0.1 mg/dL — ABNORMAL LOW (ref 0.3–1.2)
Total Protein: 6.2 g/dL — ABNORMAL LOW (ref 6.5–8.1)

## 2018-06-18 LAB — TYPE AND SCREEN
ABO/RH(D): A POS
Antibody Screen: NEGATIVE

## 2018-06-18 LAB — PROTEIN / CREATININE RATIO, URINE
Creatinine, Urine: 168 mg/dL
Protein Creatinine Ratio: 0.11 mg/mg{Cre} (ref 0.00–0.15)
Total Protein, Urine: 18 mg/dL

## 2018-06-18 LAB — CBC
HEMATOCRIT: 33.5 % — AB (ref 36.0–46.0)
HEMOGLOBIN: 11 g/dL — AB (ref 12.0–15.0)
MCH: 29.5 pg (ref 26.0–34.0)
MCHC: 32.8 g/dL (ref 30.0–36.0)
MCV: 89.8 fL (ref 80.0–100.0)
NRBC: 0 % (ref 0.0–0.2)
Platelets: 164 10*3/uL (ref 150–400)
RBC: 3.73 MIL/uL — ABNORMAL LOW (ref 3.87–5.11)
RDW: 12.8 % (ref 11.5–15.5)
WBC: 10.9 10*3/uL — ABNORMAL HIGH (ref 4.0–10.5)

## 2018-06-18 LAB — ABO/RH: ABO/RH(D): A POS

## 2018-06-18 MED ORDER — LIDOCAINE HCL (PF) 1 % IJ SOLN
30.0000 mL | INTRAMUSCULAR | Status: DC | PRN
  Filled 2018-06-18: qty 30

## 2018-06-18 MED ORDER — FENTANYL CITRATE (PF) 100 MCG/2ML IJ SOLN
100.0000 ug | INTRAMUSCULAR | Status: DC | PRN
  Administered 2018-06-18 – 2018-06-19 (×3): 100 ug via INTRAVENOUS
  Filled 2018-06-18 (×3): qty 2

## 2018-06-18 MED ORDER — SOD CITRATE-CITRIC ACID 500-334 MG/5ML PO SOLN: 30.0000 mL | ORAL | Status: DC | PRN

## 2018-06-18 MED ORDER — ACETAMINOPHEN 325 MG PO TABS: 650.0000 mg | ORAL_TABLET | ORAL | Status: DC | PRN

## 2018-06-18 MED ORDER — OXYTOCIN BOLUS FROM INFUSION
500.0000 mL | Freq: Once | INTRAVENOUS | Status: AC
  Administered 2018-06-19: 500 mL via INTRAVENOUS

## 2018-06-18 MED ORDER — MISOPROSTOL 50MCG HALF TABLET
50.0000 ug | ORAL_TABLET | ORAL | Status: DC | PRN
  Administered 2018-06-18 (×2): 50 ug via ORAL
  Filled 2018-06-18 (×3): qty 1

## 2018-06-18 MED ORDER — OXYCODONE-ACETAMINOPHEN 5-325 MG PO TABS: 2.0000 | ORAL_TABLET | ORAL | Status: DC | PRN

## 2018-06-18 MED ORDER — LACTATED RINGERS IV SOLN: 500.0000 mL | INTRAVENOUS | Status: DC | PRN

## 2018-06-18 MED ORDER — OXYTOCIN 40 UNITS IN LACTATED RINGERS INFUSION - SIMPLE MED
2.5000 [IU]/h | INTRAVENOUS | Status: DC
  Administered 2018-06-19: 2.5 [IU]/h via INTRAVENOUS

## 2018-06-18 MED ORDER — ONDANSETRON HCL 4 MG/2ML IJ SOLN: 4.0000 mg | Freq: Four times a day (QID) | INTRAMUSCULAR | Status: DC | PRN

## 2018-06-18 MED ORDER — LACTATED RINGERS IV SOLN
INTRAVENOUS | Status: DC
  Administered 2018-06-18: 125 mL/h via INTRAVENOUS
  Administered 2018-06-18 – 2018-06-19 (×3): via INTRAVENOUS

## 2018-06-18 MED ORDER — OXYCODONE-ACETAMINOPHEN 5-325 MG PO TABS: 1.0000 | ORAL_TABLET | ORAL | Status: DC | PRN

## 2018-06-18 MED ORDER — MISOPROSTOL 25 MCG QUARTER TABLET: 25.0000 ug | ORAL_TABLET | ORAL | Status: DC | PRN

## 2018-06-18 MED ORDER — TERBUTALINE SULFATE 1 MG/ML IJ SOLN
0.2500 mg | Freq: Once | INTRAMUSCULAR | Status: DC | PRN
  Filled 2018-06-18: qty 1

## 2018-06-18 NOTE — Progress Notes (Signed)
.      Subjective:  Brittney Wyatt is a 22 y.o. G1P0 at 6192w5d being seen today for ongoing prenatal care.  She is currently monitored for the following issues for this low-risk pregnancy and has Encounter for supervision of normal first pregnancy in third trimester; Chlamydia infection affecting pregnancy; Transient hypertension of pregnancy in third trimester; Uterine size date discrepancy; Acne; and Eczema on their problem list.  Patient reports no complaints.  Contractions: Irritability. Vag. Bleeding: None.  Movement: Present. Denies leaking of fluid.   The following portions of the patient's history were reviewed and updated as appropriate: allergies, current medications, past family history, past medical history, past social history, past surgical history and problem list. Problem list updated.  Objective:   Vitals:   06/18/18 1336 06/18/18 1347 06/18/18 1350  BP: (!) 146/69 (!) 165/82 (!) 158/88  Pulse: 95 (!) 123 99  Weight: 147 lb (66.7 kg)      Fetal Status: Fetal Heart Rate (bpm): 138 Fundal Height: 37 cm Movement: Present     General:  Alert, oriented and cooperative. Patient is in no acute distress.  Skin: Skin is warm and dry. No rash noted.   Cardiovascular: Normal heart rate noted  Respiratory: Normal respiratory effort, no problems with respiration noted  Abdomen: Soft, gravid, appropriate for gestational age. Pain/Pressure: Present     Pelvic:  Cervical exam deferred        Extremities: Normal range of motion.  Edema: None  Mental Status: Normal mood and affect. Normal behavior. Normal judgment and thought content.   Urinalysis:      Assessment and Plan:  Pregnancy: G1P0 at 7692w5d  1. Encounter for supervision of normal first pregnancy in third trimester   2. Elevated BP without diagnosis of hypertension - but with one severe range BP - will evaluate to confirm gestational hypertension No headaches, no blurred vision, no edema, no RUQ pain but BPS are  elevated. Too MAU for evaluation and likely for induction - Report called to MAU provider and to Dr. Debroah LoopArnold  Please refer to After Visit Summary for other counseling recommendations.  Return for schedule for postpartum exam.  Nolene BernheimERRI Jenese Mischke, RN, MSN, NP-BC Nurse Practitioner, [facility] [employer] [facility], [facility] 06/18/2018 1:57 PM

## 2018-06-18 NOTE — Progress Notes (Signed)
OB/GYN Faculty Practice: Labor Progress Note  Subjective: Doing well, not feeling any contractions.   Objective: BP 126/62   Pulse 93   Temp 98.5 F (36.9 C) (Oral)   Resp 16   Ht 5\' 4"  (1.626 m)   Wt 66.7 kg   LMP 09/13/2017 (Exact Date)   BMI 25.23 kg/m  Gen: no distress Dilation: 1 Effacement (%): 70 Cervical Position: Posterior Station: -2 Presentation: Vertex Exam by:: Kris HartmannNicole Jones, RN  Assessment and Plan: 22 y.o. G1P0 965w5d admitted for IOL 2/2 gHTN  Labor: 2nd cytotec placed, FB placed as well -- pain control: comfortable now, planning for an epidural   Fetal Well-Being: EFW 2708g (38%tile) on US on 10/24 -- Category 1 - continuous fetal monitoring  -- GBS negative  gHTN - no severe range pressures since admission, most recent BP 126/62 - CMP and CBC unremarkable - UPC collected and pending   Burman NievesJulia Aldrich Lloyd, MD Family Medicine Resident 9:11 PM

## 2018-06-18 NOTE — Anesthesia Pain Management Evaluation Note (Signed)
  CRNA Pain Management Visit Note  Patient: Brittney Wyatt, 22 y.o., female  "Hello I am a [facility] the [facility]. We have an anesthesia team available at all times to provide care throughout the hospital, including epidural management and anesthesia for C-section. I don't know your plan for the delivery whether it a natural birth, water birth, IV sedation, nitrous supplementation, doula or epidural, but we want to meet your pain goals."   1.Was your pain managed to your expectations on prior hospitalizations?   No prior hospitalizations  2.What is your expectation for pain management during this hospitalization?     Epidural  3.How can we help you reach that goal? *support**  Record the patient's initial score and the patient's pain goal.   Pain: 0  Pain Goal: 5 [facility] wants you to be able to say your pain was always managed very well.  Trellis PaganiniBREWER,Brittney Wyatt 06/18/2018

## 2018-06-18 NOTE — H&P (Addendum)
LABOR AND DELIVERY ADMISSION HISTORY AND PHYSICAL NOTE  Brittney Wyatt is a 22 y.o. female G1P0 with IUP at 323w5d by LMP presenting for IOL due to new onset GHTN. She reports positive fetal movement. She denies leakage of fluid or vaginal bleeding. Denies any HA, blurry vision, or abdominal pain.   [facility]/Complications[facility]  Pregnancy complications:  - Gestational hypertension, new onset today  - Chlamydia infection affecting pregnancy, negative TOC 8/26  - Eczema    Past Medical History: Past Medical History:  Diagnosis Date  . Anemia   . Heart murmur     Past Surgical History: Past Surgical History:  Procedure Laterality Date  . TIBIA FRACTURE SURGERY Right     Obstetrical History: OB History    Gravida  1   Para      Term      Preterm      AB      Living        SAB      TAB      Ectopic      Multiple      Live Births              Social History: Social History   Socioeconomic History  . Marital status: Single    Spouse name: Not on file  . Number of children: Not on file  . Years of education: Not on file  . Highest education level: Not on file  Occupational History  . Not on file  Social Needs  . Financial resource strain: Not on file  . Food insecurity:    Worry: Not on file    Inability: Not on file  . Transportation needs:    Medical: Not on file    Non-medical: Not on file  Tobacco Use  . Smoking status: Passive Smoke Exposure - Never Smoker  . Smokeless tobacco: Never Used  Substance and Sexual Activity  . Alcohol use: No  . Drug use: No  . Sexual activity: Not Currently    Birth control/protection: Implant    Comment: off nexplanon x1 year  Lifestyle  . Physical activity:    Days per week: Not on file    Minutes per session: Not on file  . Stress: Not on file  Relationships  . Social connections:    Talks on phone: Not on file    Gets together: Not on file    Attends religious service: Not on file     Active member of club or organization: Not on file    Attends meetings of clubs or organizations: Not on file    Relationship status: Not on file  Other Topics Concern  . Not on file  Social History Narrative  . Not on file    Family History: Family History  Problem Relation Age of Onset  . Hypertension Mother   . Diabetes Mother     Allergies: No Known Allergies  Medications Prior to Admission  Medication Sig Dispense Refill Last Dose  . Prenatal Vit-Fe Fumarate-FA (PRENATAL COMPLETE) 14-0.4 MG TABS Take 1 tablet by mouth daily. (Patient not taking: Reported on 06/18/2018) 60 each 0 Not Taking     Review of Systems  All systems reviewed and negative except as stated in HPI  Physical Exam Blood pressure (!) 145/77, pulse (!) 110, temperature 98.2 F (36.8 C), temperature source Oral, resp. rate 14, height 5\' 4"  (1.626 m), weight 66.7 kg, last menstrual period 09/13/2017. General appearance: alert, oriented, NAD  Lungs: normal respiratory effort Heart: regular rate Abdomen: soft, non-tender; gravid, FH appropriate for GA Extremities: No calf swelling or tenderness Presentation: cephalic Fetal monitoring: baseline 130, mod var, + accel, - decel  Uterine activity: irritable     Prenatal labs: ABO, Rh: A/Positive/-- (05/01 0941) Antibody: Negative (05/01 0941) Rubella: 3.04 (05/01 0941) RPR: Non Reactive (08/26 0828)  HBsAg: Negative (05/01 0941)  HIV: Non Reactive (08/26 0828)  GC/Chlamydia: negative  GBS:   Negative  2-hr GTT: Normal  Genetic screening: Normal  Anatomy US: Normal female   Prenatal Transfer Tool  Maternal Diabetes: No Genetic Screening: Normal Maternal Ultrasounds/Referrals: Normal Fetal Ultrasounds or other Referrals:  None Maternal Substance Abuse:  No Significant Maternal Medications:  None Significant Maternal Lab Results: Lab values include: Group B Strep negative  Results for orders placed or performed during the hospital encounter  of 06/18/18 (from the past 24 hour(s))  CBC   Collection Time: 06/18/18  2:35 PM  Result Value Ref Range   WBC 10.9 (H) 4.0 - 10.5 K/uL   RBC 3.73 (L) 3.87 - 5.11 MIL/uL   Hemoglobin 11.0 (L) 12.0 - 15.0 g/dL   HCT 40.9 (L) 81.1 - 91.4 %   MCV 89.8 80.0 - 100.0 fL   MCH 29.5 26.0 - 34.0 pg   MCHC 32.8 30.0 - 36.0 g/dL   RDW 78.2 95.6 - 21.3 %   Platelets 164 150 - 400 K/uL   nRBC 0.0 0.0 - 0.2 %    Patient Active Problem List   Diagnosis Date Noted  . Gestational hypertension affecting first pregnancy 06/18/2018  . Labor and delivery, indication for care 06/18/2018  . Uterine size date discrepancy 05/27/2018  . Transient hypertension of pregnancy in third trimester 05/12/2018  . Chlamydia infection affecting pregnancy 01/08/2018  . Encounter for supervision of normal first pregnancy in third trimester 12/04/2017  . Acne 04/11/2012  . Eczema 04/11/2012    Assessment: Brittney Wyatt is a 22 y.o. G1P0 at [redacted]w[redacted]d here for IOL due to [facility]. Pregnancy complicated by new onset GHTN and chlamydia with negative TOC. GBS negative.    #Labor: IOL with cytotec x1, consideration for FB placement on next evaluation.  #Pain: Considering epidural, aware of other pain control methods  #FWB: Cat 1 strip  #ID: GBS negative  #MOF: Breastfeeding  #MOC: Depo  #Circ: Yes, likely outpatient   1. Gestational hypertension, new onset: Noted today while in the office, no previous history of hypertensive disorder. BP 145/77 on arrival.  1 severe range of 165/82 in the office earlier, moderate range on recheck 3 minutes afterwards. Asymptomatic.   -Obtain CMP, UPC, and CBC   -Monitor BP, low threshold for starting Mg if another severe range pressure   -IV labetalol PRN SBP > 160, DBP >110   Brittney Wyatt 06/18/2018, 3:53 PM   OB FELLOW HISTORY AND PHYSICAL ATTESTATION  I have seen and examined this patient; I agree with above documentation in the resident's note.   Gwenevere Abbot, MD OB  Fellow  06/18/2018, 8:35 PM

## 2018-06-19 ENCOUNTER — Inpatient Hospital Stay (HOSPITAL_COMMUNITY): Payer: Medicaid Other | Admitting: Anesthesiology

## 2018-06-19 ENCOUNTER — Encounter (HOSPITAL_COMMUNITY): Payer: Self-pay

## 2018-06-19 DIAGNOSIS — O134 Gestational [pregnancy-induced] hypertension without significant proteinuria, complicating childbirth: Secondary | ICD-10-CM

## 2018-06-19 DIAGNOSIS — Z3A39 39 weeks gestation of pregnancy: Secondary | ICD-10-CM

## 2018-06-19 LAB — RPR: RPR Ser Ql: NONREACTIVE

## 2018-06-19 LAB — CBC
HEMATOCRIT: 33 % — AB (ref 36.0–46.0)
Hemoglobin: 11 g/dL — ABNORMAL LOW (ref 12.0–15.0)
MCH: 29.9 pg (ref 26.0–34.0)
MCHC: 33.3 g/dL (ref 30.0–36.0)
MCV: 89.7 fL (ref 80.0–100.0)
NRBC: 0 % (ref 0.0–0.2)
Platelets: 167 10*3/uL (ref 150–400)
RBC: 3.68 MIL/uL — AB (ref 3.87–5.11)
RDW: 12.7 % (ref 11.5–15.5)
WBC: 14.8 10*3/uL — ABNORMAL HIGH (ref 4.0–10.5)

## 2018-06-19 MED ORDER — METHYLERGONOVINE MALEATE 0.2 MG/ML IJ SOLN: 0.2000 mg | INTRAMUSCULAR | Status: DC | PRN

## 2018-06-19 MED ORDER — COCONUT OIL OIL: 1.0000 "application " | TOPICAL_OIL | Status: DC | PRN

## 2018-06-19 MED ORDER — SIMETHICONE 80 MG PO CHEW: 80.0000 mg | CHEWABLE_TABLET | ORAL | Status: DC | PRN

## 2018-06-19 MED ORDER — TERBUTALINE SULFATE 1 MG/ML IJ SOLN
0.2500 mg | Freq: Once | INTRAMUSCULAR | Status: DC | PRN
  Filled 2018-06-19: qty 1

## 2018-06-19 MED ORDER — MISOPROSTOL 200 MCG PO TABS
ORAL_TABLET | ORAL | Status: AC
  Administered 2018-06-19: 800 ug via RECTAL
  Filled 2018-06-19: qty 4

## 2018-06-19 MED ORDER — WITCH HAZEL-GLYCERIN EX PADS: 1.0000 "application " | MEDICATED_PAD | CUTANEOUS | Status: DC | PRN

## 2018-06-19 MED ORDER — PHENYLEPHRINE 40 MCG/ML (10ML) SYRINGE FOR IV PUSH (FOR BLOOD PRESSURE SUPPORT)
80.0000 ug | PREFILLED_SYRINGE | INTRAVENOUS | Status: DC | PRN
  Filled 2018-06-19: qty 10
  Filled 2018-06-19: qty 5

## 2018-06-19 MED ORDER — BENZOCAINE-MENTHOL 20-0.5 % EX AERO: 1.0000 "application " | INHALATION_SPRAY | CUTANEOUS | Status: DC | PRN

## 2018-06-19 MED ORDER — FLEET ENEMA 7-19 GM/118ML RE ENEM: 1.0000 | ENEMA | Freq: Every day | RECTAL | Status: DC | PRN

## 2018-06-19 MED ORDER — TETANUS-DIPHTH-ACELL PERTUSSIS 5-2.5-18.5 LF-MCG/0.5 IM SUSP: 0.5000 mL | Freq: Once | INTRAMUSCULAR | Status: DC

## 2018-06-19 MED ORDER — DIBUCAINE 1 % RE OINT: 1.0000 "application " | TOPICAL_OINTMENT | RECTAL | Status: DC | PRN

## 2018-06-19 MED ORDER — IBUPROFEN 600 MG PO TABS
600.0000 mg | ORAL_TABLET | Freq: Four times a day (QID) | ORAL | Status: DC
  Administered 2018-06-20 – 2018-06-21 (×6): 600 mg via ORAL
  Filled 2018-06-19 (×6): qty 1

## 2018-06-19 MED ORDER — TRANEXAMIC ACID-NACL 1000-0.7 MG/100ML-% IV SOLN
INTRAVENOUS | Status: AC
  Filled 2018-06-19: qty 100

## 2018-06-19 MED ORDER — FERROUS SULFATE 325 (65 FE) MG PO TABS
325.0000 mg | ORAL_TABLET | Freq: Two times a day (BID) | ORAL | Status: DC
  Administered 2018-06-20 – 2018-06-21 (×3): 325 mg via ORAL
  Filled 2018-06-19 (×3): qty 1

## 2018-06-19 MED ORDER — BISACODYL 10 MG RE SUPP: 10.0000 mg | Freq: Every day | RECTAL | Status: DC | PRN

## 2018-06-19 MED ORDER — LIDOCAINE HCL (PF) 1 % IJ SOLN
INTRAMUSCULAR | Status: DC | PRN
  Administered 2018-06-19: 8 mL via EPIDURAL

## 2018-06-19 MED ORDER — MISOPROSTOL 25 MCG QUARTER TABLET
ORAL_TABLET | ORAL | Status: AC
  Administered 2018-06-19: 25 ug via VAGINAL
  Filled 2018-06-19: qty 1

## 2018-06-19 MED ORDER — FENTANYL 2.5 MCG/ML BUPIVACAINE 1/10 % EPIDURAL INFUSION (WH - ANES)
14.0000 mL/h | INTRAMUSCULAR | Status: DC | PRN
  Administered 2018-06-19: 14 mL/h via EPIDURAL
  Filled 2018-06-19: qty 100

## 2018-06-19 MED ORDER — ONDANSETRON HCL 4 MG/2ML IJ SOLN: 4.0000 mg | INTRAMUSCULAR | Status: DC | PRN

## 2018-06-19 MED ORDER — DIPHENHYDRAMINE HCL 50 MG/ML IJ SOLN: 12.5000 mg | INTRAMUSCULAR | Status: DC | PRN

## 2018-06-19 MED ORDER — PHENYLEPHRINE 40 MCG/ML (10ML) SYRINGE FOR IV PUSH (FOR BLOOD PRESSURE SUPPORT)
80.0000 ug | PREFILLED_SYRINGE | INTRAVENOUS | Status: DC | PRN
  Filled 2018-06-19: qty 5

## 2018-06-19 MED ORDER — METHYLERGONOVINE MALEATE 0.2 MG PO TABS: 0.2000 mg | ORAL_TABLET | ORAL | Status: DC | PRN

## 2018-06-19 MED ORDER — ACETAMINOPHEN 325 MG PO TABS: 650.0000 mg | ORAL_TABLET | ORAL | Status: DC | PRN

## 2018-06-19 MED ORDER — ZOLPIDEM TARTRATE 5 MG PO TABS: 5.0000 mg | ORAL_TABLET | Freq: Every evening | ORAL | Status: DC | PRN

## 2018-06-19 MED ORDER — OXYTOCIN 10 UNIT/ML IJ SOLN
INTRAMUSCULAR | Status: AC
  Filled 2018-06-19: qty 1

## 2018-06-19 MED ORDER — EPHEDRINE 5 MG/ML INJ
10.0000 mg | INTRAVENOUS | Status: DC | PRN
  Filled 2018-06-19: qty 2

## 2018-06-19 MED ORDER — MEASLES, MUMPS & RUBELLA VAC IJ SOLR
0.5000 mL | Freq: Once | INTRAMUSCULAR | Status: DC
  Filled 2018-06-19: qty 0.5

## 2018-06-19 MED ORDER — PRENATAL MULTIVITAMIN CH
1.0000 | ORAL_TABLET | Freq: Every day | ORAL | Status: DC
  Administered 2018-06-20 – 2018-06-21 (×2): 1 via ORAL
  Filled 2018-06-19 (×2): qty 1

## 2018-06-19 MED ORDER — ONDANSETRON HCL 4 MG PO TABS: 4.0000 mg | ORAL_TABLET | ORAL | Status: DC | PRN

## 2018-06-19 MED ORDER — OXYTOCIN 40 UNITS IN LACTATED RINGERS INFUSION - SIMPLE MED
1.0000 m[IU]/min | INTRAVENOUS | Status: DC
  Administered 2018-06-19: 2 m[IU]/min via INTRAVENOUS
  Filled 2018-06-19: qty 1000

## 2018-06-19 MED ORDER — DIPHENHYDRAMINE HCL 25 MG PO CAPS: 25.0000 mg | ORAL_CAPSULE | Freq: Four times a day (QID) | ORAL | Status: DC | PRN

## 2018-06-19 MED ORDER — MISOPROSTOL 200 MCG PO TABS
800.0000 ug | ORAL_TABLET | Freq: Once | ORAL | Status: AC
  Administered 2018-06-19: 800 ug via RECTAL

## 2018-06-19 MED ORDER — DOCUSATE SODIUM 100 MG PO CAPS
100.0000 mg | ORAL_CAPSULE | Freq: Two times a day (BID) | ORAL | Status: DC
  Administered 2018-06-20 – 2018-06-21 (×4): 100 mg via ORAL
  Filled 2018-06-19 (×3): qty 1

## 2018-06-19 MED ORDER — LACTATED RINGERS IV SOLN: 500.0000 mL | Freq: Once | INTRAVENOUS | Status: DC

## 2018-06-19 NOTE — Progress Notes (Signed)
LABOR PROGRESS NOTE  Brittney Wyatt is a 22 y.o. G1P0 at 2542w6d  admitted for IOL for gHTN  Subjective: Resting comfortably   Objective: BP (!) 142/82   Pulse 85   Temp 98.3 F (36.8 C) (Oral)   Resp 16   Ht 5\' 4"  (1.626 m)   Wt 66.7 kg   LMP 09/13/2017 (Exact Date)   BMI 25.23 kg/m  or  Vitals:   06/19/18 0648 06/19/18 0736 06/19/18 1040 06/19/18 1338  BP: (!) 146/80 139/83 137/70 (!) 142/82  Pulse: 99 89 90 85  Resp:  16 16 16   Temp: 98.3 F (36.8 C)     TempSrc: Oral     Weight:      Height:        Dilation: 5 Effacement (%): 80 Cervical Position: Posterior Station: -2 Presentation: Vertex Exam by:: Kris HartmannNicole Jones, RN FHT: baseline rate 130, moderate varibility, +acel, -decel Toco: irregular  Labs: Lab Results  Component Value Date   WBC 10.9 (H) 06/18/2018   HGB 11.0 (L) 06/18/2018   HCT 33.5 (L) 06/18/2018   MCV 89.8 06/18/2018   PLT 164 06/18/2018    Patient Active Problem List   Diagnosis Date Noted  . Gestational hypertension affecting first pregnancy 06/18/2018  . Labor and delivery, indication for care 06/18/2018  . Uterine size date discrepancy 05/27/2018  . Transient hypertension of pregnancy in third trimester 05/12/2018  . Chlamydia infection affecting pregnancy 01/08/2018  . Encounter for supervision of normal first pregnancy in third trimester 12/04/2017  . Acne 04/11/2012  . Eczema 04/11/2012    Assessment / Plan: 22 y.o. G1P0 at 7042w6d here for IOL for gHTN  Labor: progressing well on pitocin, @8miliunits /min Fetal Wellbeing:  Cat 1 Pain Control:  Per patient preference Anticipated MOD:  NSVD  Brittney ManisSherin Lucio Litsey, DO PGY-2 06/19/2018, 2:55 PM

## 2018-06-19 NOTE — Progress Notes (Signed)
Patient ID: Derry LoryJertavia Wyatt, female   DOB: 09/20/1995, 20 y.o.   MRN: [id number]  Cervical foley recently came out; pt with mild cramping  BPs 139/83, 146/80, other VSS FHR 130s, +accels, no decels Ctx irreg, mild Cx per RN 4-5/80/vtx -2  IUP@term  gHTN  IOL process  Will allow to order light breakfast and then start Pit 2x2 Anticipate SVD  Cam HaiSHAW,  CNM 06/19/2018 8:53 AM

## 2018-06-19 NOTE — Progress Notes (Signed)
OB/GYN Faculty Practice: Fetal Tracing Note  Objective: BP 119/60   Pulse 97   Temp 97.8 F (36.6 C) (Oral)   Resp 16   Ht 5\' 4"  (1.626 m)   Wt 66.7 kg   LMP 09/13/2017 (Exact Date)   BMI 25.23 kg/m   Dilation: 1.5 Effacement (%): 70 Cervical Position: Posterior Station: -2 Presentation: Vertex Exam by:: Brittney NievesJulia Neesa Knapik MD   FHR: baseline 120, 15x15 accels, no decels  Uterine Activity: contractions every 1-3 minutes  Assessment and Plan: 22 y.o. G1P0 4380w6d admitted for IOL 2/2 gHTN.  Labor: FB still in place, will give 3rd cytotec once contractions have spaced out -- pain control: IV fentanyl now, planning for an Epidural   Fetal Well-Being: EFW 2708g (38%tile) on US on 10/24 -- Category 1 - continuous fetal monitoring  -- GBS negative  gHTN - no severe range pressures since admission, most recent BP 119/60 - CMP and CBC unremarkable, UPC normal   Brittney NievesJulia Mazikeen Hehn, MD Family Medicine Resident 4:25 AM

## 2018-06-19 NOTE — Plan of Care (Signed)
  Problem: Safety: Goal: Risk of complications during labor and delivery will decrease Outcome: Completed/Met   Problem: Pain Management: Goal: Relief or control of pain from uterine contractions will improve Outcome: Completed/Met

## 2018-06-19 NOTE — Progress Notes (Signed)
Patient getting into shower now. Will call when finished.

## 2018-06-19 NOTE — Anesthesia Preprocedure Evaluation (Signed)
Anesthesia Evaluation  Patient identified by MRN, date of birth, ID band Patient awake    Reviewed: Allergy & Precautions, H&P , NPO status , Patient's Chart, lab work & pertinent test results, reviewed documented beta blocker date and time   Airway Mallampati: II  TM Distance: >3 FB Neck ROM: full    Dental no notable dental hx.    Pulmonary neg pulmonary ROS,    Pulmonary exam normal breath sounds clear to auscultation       Cardiovascular negative cardio ROS Normal cardiovascular exam Rhythm:regular Rate:Normal     Neuro/Psych negative neurological ROS  negative psych ROS   GI/Hepatic negative GI ROS, Neg liver ROS,   Endo/Other  negative endocrine ROS  Renal/GU negative Renal ROS  negative genitourinary   Musculoskeletal   Abdominal   Peds  Hematology negative hematology ROS (+)   Anesthesia Other Findings   Reproductive/Obstetrics (+) Pregnancy                             Anesthesia Physical Anesthesia Plan  ASA: II  Anesthesia Plan: Epidural   Post-op Pain Management:    Induction:   PONV Risk Score and Plan:   Airway Management Planned:   Additional Equipment:   Intra-op Plan:   Post-operative Plan:   Informed Consent: I have reviewed the patients History and Physical, chart, labs and discussed the procedure including the risks, benefits and alternatives for the proposed anesthesia with the patient or authorized representative who has indicated his/her understanding and acceptance.     Dental Advisory Given  Plan Discussed with: CRNA, Anesthesiologist and Surgeon  Anesthesia Plan Comments: (Labs checked- platelets confirmed with RN in room. Fetal heart tracing, per RN, reported to be stable enough for sitting procedure. Discussed epidural, and patient consents to the procedure:  included risk of possible headache,backache, failed block, allergic reaction, and  nerve injury. This patient was asked if she had any questions or concerns before the procedure started.)        Anesthesia Quick Evaluation  

## 2018-06-19 NOTE — Anesthesia Procedure Notes (Signed)
Epidural Patient location during procedure: OB Start time: 06/19/2018 5:40 PM End time: 06/19/2018 5:47 PM  Staffing Anesthesiologist: Bethena Midgetddono, Quida Glasser, MD  Preanesthetic Checklist Completed: patient identified, site marked, surgical consent, pre-op evaluation, timeout performed, IV checked, risks and benefits discussed and monitors and equipment checked  Epidural Patient position: sitting Prep: site prepped and draped and DuraPrep Patient monitoring: continuous pulse ox and blood pressure Approach: midline Location: L3-L4 Injection technique: LOR air  Needle:  Needle type: Tuohy  Needle gauge: 17 G Needle length: 9 cm and 9 Needle insertion depth: 5 cm cm Catheter type: closed end flexible Catheter size: 19 Gauge Catheter at skin depth: 10 cm Test dose: negative  Assessment Events: blood not aspirated, injection not painful, no injection resistance, negative IV test and no paresthesia

## 2018-06-20 NOTE — Lactation Note (Signed)
This note was copied from a baby's chart. Lactation Consultation Note  Patient Name: Brittney Wyatt: 06/20/2018   [facility]:  I noticed mother had only attempted breast feeding one time since delivery.  Verified with mother and she stated she wants to bottle feed only.  RN updated.                  [name] R [name] 06/20/2018, 1:00 PM

## 2018-06-20 NOTE — Anesthesia Postprocedure Evaluation (Signed)
Anesthesia Post Note  Patient: Brittney Wyatt  Procedure(s) Performed: AN AD HOC LABOR EPIDURAL     Patient location during evaluation: Mother Baby Anesthesia Type: Epidural Level of consciousness: awake and alert and oriented Pain management: satisfactory to patient Vital Signs Assessment: post-procedure vital signs reviewed and stable Respiratory status: respiratory function stable Cardiovascular status: stable Postop Assessment: no headache, no backache, epidural receding, patient able to bend at knees, no signs of nausea or vomiting and adequate PO intake Anesthetic complications: no    Last Vitals:  Vitals:   06/20/18 0055 06/20/18 0513  BP: 132/68 (!) 122/59  Pulse: 96 80  Resp: 18 18  Temp: 37 C 36.9 C  SpO2: 99% 99%    Last Pain:  Vitals:   06/20/18 0630  TempSrc:   PainSc: 0-No pain   Pain Goal:                 Tinita Brooker

## 2018-06-20 NOTE — Progress Notes (Signed)
Post Partum Day 1 Subjective: no complaints, up ad lib, voiding and tolerating PO, small lochia, plans to breastfeed, Depo-Provera  Objective: Blood pressure (!) 122/59, pulse 80, temperature 98.4 F (36.9 C), temperature source Axillary, resp. rate 18, height 5\' 4"  (1.626 m), weight 66.7 kg, last menstrual period 09/13/2017, SpO2 99 %, unknown if currently breastfeeding.  Physical Exam:  General: alert, cooperative and no distress Lochia:normal flow Chest: CTAB Heart: RRR no m/r/g Abdomen: +BS, soft, nontender,  Uterine Fundus: firm DVT Evaluation: No evidence of DVT seen on physical exam. Extremities: trace edema  Recent Labs    06/18/18 1435 06/19/18 1722  HGB 11.0* 11.0*  HCT 33.5* 33.0*    Assessment/Plan: Plan for discharge tomorrow   LOS: 2 days   Jacklyn ShellFrances Cresenzo-Dishmon 06/20/2018, 7:31 AM

## 2018-06-21 LAB — BIRTH TISSUE RECOVERY COLLECTION (PLACENTA DONATION)

## 2018-06-21 NOTE — Discharge Instructions (Signed)

## 2018-06-21 NOTE — Discharge Summary (Signed)
Obstetrics Discharge Summary OB/GYN Faculty Practice   Patient Name: Brittney Wyatt DOB: May 16, 1996 MRN: [id number]  Date of admission: 06/18/2018 Delivering MD: Oralia ManisABRAHAM, SHERIN   Date of discharge: 06/21/2018  Admitting diagnosis: 39.5WKS HP Intrauterine pregnancy: 7860w6d     Secondary diagnosis:   Principal Problem:   Labor and delivery, indication for care Active Problems:   Gestational hypertension affecting first pregnancy   Additional problems:  . History of chalmydia in pregnancy     Discharge diagnosis: Term Pregnancy Delivered and Gestational Hypertension                                            Postpartum procedures: None  Complications: none  Hospital course: Brittney Wyatt is a 22 y.o. 7660w6d who was admitted for IOL for gHTN. Her pregnancy was complicated by gHTN, chalmydia. Her labor course was notable for induction for elevated BPs, one severe range in office followed by only moderate range pressures here; negative HELLP and normal UP:C. She was induced with cytotec, foley bulb, and pitocin Delivery was complicated by delayed delivery of shoulders requiring rotation. Please see delivery/op note for additional details. Her postpartum course was uncomplicated. She was bottle feeding. By day of discharge, she was passing flatus, urinating, eating and drinking without difficulty. Her pain was well-controlled. She will follow-up in clinic in 4 weeks. A referral was placed to [facility] for 1 week BP check and she will call to make an appt for BP check in clinic if this isn't arranged.  Physical exam  Vitals:   06/20/18 1121 06/20/18 1426 06/20/18 2207 06/21/18 0538  BP: 130/82 130/82 137/80 124/69  Pulse: 92 87 81 77  Resp:  18 16 16   Temp:  99.2 F (37.3 C) 99 F (37.2 C) 98.8 F (37.1 C)  TempSrc:  Oral Axillary Oral  SpO2: 100% 100% 100%   Weight:      Height:       General: well-appearing no acute distress Lochia: appropriate Uterine Fundus:  firm Incision: N/A DVT Evaluation: No evidence of DVT seen on physical exam. Labs: Lab Results  Component Value Date   WBC 14.8 (H) 06/19/2018   HGB 11.0 (L) 06/19/2018   HCT 33.0 (L) 06/19/2018   MCV 89.7 06/19/2018   PLT 167 06/19/2018   CMP Latest Ref Rng & Units 06/18/2018  Glucose 70 - 99 mg/dL 84  BUN 6 - 20 mg/dL 9  Creatinine 4.090.44 - 8.111.00 mg/dL 9.140.56  Sodium 782135 - 956145 mmol/L 135  Potassium 3.5 - 5.1 mmol/L 3.7  Chloride 98 - 111 mmol/L 107  CO2 22 - 32 mmol/L 20(L)  Calcium 8.9 - 10.3 mg/dL 8.9  Total Protein 6.5 - 8.1 g/dL 6.2(L)  Total Bilirubin 0.3 - 1.2 mg/dL <2.1(H<0.1(L)  Alkaline Phos 38 - 126 U/L 188(H)  AST 15 - 41 U/L 26  ALT 0 - 44 U/L 19    Discharge instructions: Per After Visit Summary and "Baby and Me Booklet"  After visit meds:  Allergies as of 06/21/2018   No Known Allergies     Medication List    TAKE these medications   acetaminophen 500 MG tablet Commonly known as:  TYLENOL Take 500 mg by mouth every 6 (six) hours as needed for headache.   prenatal vitamin w/FE, FA 29-1 MG Chew chewable tablet Chew 1 tablet by mouth daily at 12 noon.  Postpartum contraception: Depo Provera Diet: Routine Diet Activity: Advance as tolerated. Pelvic rest for 6 weeks.   Outpatient follow up:1 week for BP check Follow-up Appt: Future Appointments  Date Time Provider Department Center  07/21/2018  3:55 PM Allie Bossier, MD [facility]   Follow-up Visit:No follow-ups on file.  Newborn Data: Live born female  Birth Weight: 7 lb 7.6 oz (3391 g) APGAR: 8, 9  Newborn Delivery   Birth date/time:  06/19/2018 21:15:00 Delivery type:  Vaginal, Spontaneous     Baby Feeding: Bottle Disposition:home with mother  Aura Camps, MD OB/GYN Fellow, Faculty Practice

## 2018-06-25 ENCOUNTER — Encounter: Payer: Medicaid Other | Admitting: Nurse Practitioner

## 2018-07-21 ENCOUNTER — Encounter: Payer: Self-pay | Admitting: Obstetrics & Gynecology

## 2018-07-21 ENCOUNTER — Ambulatory Visit (INDEPENDENT_AMBULATORY_CARE_PROVIDER_SITE_OTHER): Payer: Medicaid Other | Admitting: Obstetrics & Gynecology

## 2018-07-21 DIAGNOSIS — Z1389 Encounter for screening for other disorder: Secondary | ICD-10-CM | POA: Diagnosis not present

## 2018-07-21 MED ORDER — NORGESTREL-ETHINYL ESTRADIOL 0.3-30 MG-MCG PO TABS: 1.0000 | ORAL_TABLET | Freq: Every day | ORAL | 11 refills | Status: DC

## 2018-07-21 NOTE — Progress Notes (Signed)
Subjective:     Brittney Wyatt is a 22 y.o. female who presents for a postpartum visit. She is 4 weeks postpartum following a spontaneous vaginal delivery. I have fully reviewed the prenatal and intrapartum course. The delivery was at 763w6d gestational weeks. Outcome: spontaneous vaginal delivery. Anesthesia: epidural. Postpartum course has been unremarkable. Baby's course has been uneventful. Baby is feeding by bottle - Daron OfferGerber Goodstart. Bleeding no bleeding. Bowel function is normal. Bladder function is normal. Patient is not sexually active. Contraception method is OCP (estrogen/progesterone). Postpartum depression screening: negative.  The following portions of the patient's history were reviewed and updated as appropriate: allergies, current medications, past family history, past medical history, past social history, past surgical history and problem list.  Review of Systems Pertinent items are noted in HPI.   She lives with her mom.  Objective:    LMP 09/13/2017 (Exact Date)   General:  alert   Breasts:  inspection negative, no nipple discharge or bleeding, no masses or nodularity palpable  Lungs: clear to auscultation bilaterally  Heart:  regular rate and rhythm, S1, S2 normal, no murmur, click, rub or gallop  Abdomen: soft, non-tender; bowel sounds normal; no masses,  no organomegaly   Vulva:  not evaluated  Vagina: not evaluated  Cervix:  not evaluated  Corpus: not examined  Adnexa:  not evaluated  Rectal Exam: Not performed.        Assessment:     Normal postpartum exam. Pap smear not done at today's visit. She will get her first pap when she is 22 years old this summer.  Plan:    1. Contraception: OCP (estrogen/progesterone)  - Recommend back up method for 2 weeks  3. Follow up in: 8 months or as needed. for annual

## 2019-04-06 DIAGNOSIS — N939 Abnormal uterine and vaginal bleeding, unspecified: Secondary | ICD-10-CM | POA: Diagnosis not present

## 2019-04-06 DIAGNOSIS — Z3201 Encounter for pregnancy test, result positive: Secondary | ICD-10-CM | POA: Diagnosis not present

## 2019-04-21 DIAGNOSIS — Z3201 Encounter for pregnancy test, result positive: Secondary | ICD-10-CM | POA: Diagnosis not present

## 2019-04-21 DIAGNOSIS — Z3682 Encounter for antenatal screening for nuchal translucency: Secondary | ICD-10-CM | POA: Diagnosis not present

## 2019-04-21 DIAGNOSIS — Z3A09 9 weeks gestation of pregnancy: Secondary | ICD-10-CM | POA: Diagnosis not present

## 2019-04-21 DIAGNOSIS — Z3481 Encounter for supervision of other normal pregnancy, first trimester: Secondary | ICD-10-CM | POA: Diagnosis not present

## 2019-04-21 DIAGNOSIS — Z124 Encounter for screening for malignant neoplasm of cervix: Secondary | ICD-10-CM | POA: Diagnosis not present

## 2019-04-21 DIAGNOSIS — Z3689 Encounter for other specified antenatal screening: Secondary | ICD-10-CM | POA: Diagnosis not present

## 2019-04-21 DIAGNOSIS — Z8759 Personal history of other complications of pregnancy, childbirth and the puerperium: Secondary | ICD-10-CM | POA: Diagnosis not present

## 2019-04-21 DIAGNOSIS — O3680X0 Pregnancy with inconclusive fetal viability, not applicable or unspecified: Secondary | ICD-10-CM | POA: Diagnosis not present

## 2019-04-27 DIAGNOSIS — Z3A10 10 weeks gestation of pregnancy: Secondary | ICD-10-CM | POA: Diagnosis not present

## 2019-04-27 DIAGNOSIS — O3680X0 Pregnancy with inconclusive fetal viability, not applicable or unspecified: Secondary | ICD-10-CM | POA: Diagnosis not present

## 2019-05-12 DIAGNOSIS — Z3A12 12 weeks gestation of pregnancy: Secondary | ICD-10-CM | POA: Diagnosis not present

## 2019-05-12 DIAGNOSIS — Z3682 Encounter for antenatal screening for nuchal translucency: Secondary | ICD-10-CM | POA: Diagnosis not present

## 2019-07-07 DIAGNOSIS — Z3A20 20 weeks gestation of pregnancy: Secondary | ICD-10-CM | POA: Diagnosis not present

## 2019-07-07 DIAGNOSIS — Z36 Encounter for antenatal screening for chromosomal anomalies: Secondary | ICD-10-CM | POA: Diagnosis not present

## 2019-07-07 DIAGNOSIS — Z363 Encounter for antenatal screening for malformations: Secondary | ICD-10-CM | POA: Diagnosis not present

## 2019-09-02 DIAGNOSIS — Z131 Encounter for screening for diabetes mellitus: Secondary | ICD-10-CM | POA: Diagnosis not present

## 2019-09-02 DIAGNOSIS — Z3A28 28 weeks gestation of pregnancy: Secondary | ICD-10-CM | POA: Diagnosis not present

## 2019-09-02 DIAGNOSIS — Z3689 Encounter for other specified antenatal screening: Secondary | ICD-10-CM | POA: Diagnosis not present

## 2019-10-28 DIAGNOSIS — Z3A36 36 weeks gestation of pregnancy: Secondary | ICD-10-CM | POA: Diagnosis not present

## 2019-11-11 DIAGNOSIS — O321XX0 Maternal care for breech presentation, not applicable or unspecified: Secondary | ICD-10-CM | POA: Diagnosis not present

## 2019-11-11 DIAGNOSIS — Z3A38 38 weeks gestation of pregnancy: Secondary | ICD-10-CM | POA: Diagnosis not present

## 2019-11-12 DIAGNOSIS — Z538 Procedure and treatment not carried out for other reasons: Secondary | ICD-10-CM | POA: Diagnosis not present

## 2019-11-12 DIAGNOSIS — Z3A36 36 weeks gestation of pregnancy: Secondary | ICD-10-CM | POA: Diagnosis not present

## 2019-11-12 DIAGNOSIS — O321XX0 Maternal care for breech presentation, not applicable or unspecified: Secondary | ICD-10-CM | POA: Diagnosis not present

## 2019-11-12 DIAGNOSIS — O329XX0 Maternal care for malpresentation of fetus, unspecified, not applicable or unspecified: Secondary | ICD-10-CM | POA: Diagnosis not present

## 2019-11-12 DIAGNOSIS — Z3A38 38 weeks gestation of pregnancy: Secondary | ICD-10-CM | POA: Diagnosis not present

## 2019-11-13 DIAGNOSIS — Z01812 Encounter for preprocedural laboratory examination: Secondary | ICD-10-CM | POA: Diagnosis not present

## 2019-11-13 DIAGNOSIS — Z3A38 38 weeks gestation of pregnancy: Secondary | ICD-10-CM | POA: Diagnosis not present

## 2019-11-13 DIAGNOSIS — Z20822 Contact with and (suspected) exposure to covid-19: Secondary | ICD-10-CM | POA: Diagnosis not present

## 2019-11-16 DIAGNOSIS — O321XX0 Maternal care for breech presentation, not applicable or unspecified: Secondary | ICD-10-CM | POA: Diagnosis not present

## 2019-11-16 DIAGNOSIS — Z3A39 39 weeks gestation of pregnancy: Secondary | ICD-10-CM | POA: Diagnosis not present

## 2019-11-27 DIAGNOSIS — Z111 Encounter for screening for respiratory tuberculosis: Secondary | ICD-10-CM | POA: Diagnosis not present

## 2019-11-27 DIAGNOSIS — Z0000 Encounter for general adult medical examination without abnormal findings: Secondary | ICD-10-CM | POA: Diagnosis not present

## 2019-11-27 DIAGNOSIS — Z23 Encounter for immunization: Secondary | ICD-10-CM | POA: Diagnosis not present

## 2020-01-15 DIAGNOSIS — Z1388 Encounter for screening for disorder due to exposure to contaminants: Secondary | ICD-10-CM | POA: Diagnosis not present

## 2020-01-15 DIAGNOSIS — Z0389 Encounter for observation for other suspected diseases and conditions ruled out: Secondary | ICD-10-CM | POA: Diagnosis not present

## 2020-01-15 DIAGNOSIS — Z3009 Encounter for other general counseling and advice on contraception: Secondary | ICD-10-CM | POA: Diagnosis not present

## 2020-01-15 DIAGNOSIS — N898 Other specified noninflammatory disorders of vagina: Secondary | ICD-10-CM | POA: Diagnosis not present

## 2020-01-15 DIAGNOSIS — Z30011 Encounter for initial prescription of contraceptive pills: Secondary | ICD-10-CM | POA: Diagnosis not present

## 2020-01-15 DIAGNOSIS — Z01419 Encounter for gynecological examination (general) (routine) without abnormal findings: Secondary | ICD-10-CM | POA: Diagnosis not present

## 2020-02-04 DIAGNOSIS — Z419 Encounter for procedure for purposes other than remedying health state, unspecified: Secondary | ICD-10-CM | POA: Diagnosis not present

## 2020-02-26 IMAGING — US US ABDOMEN LIMITED
1 series · 15 of 25 positions shown · non-contrast
Comparison: None.

CLINICAL DATA: Thirty-four weeks pregnant, right upper quadrant
pain, leukocytosis

EXAM:
ULTRASOUND ABDOMEN LIMITED RIGHT UPPER QUADRANT

[Series 1: us abdomen limited · 15 of 35 slices shown]
[im 1/35]
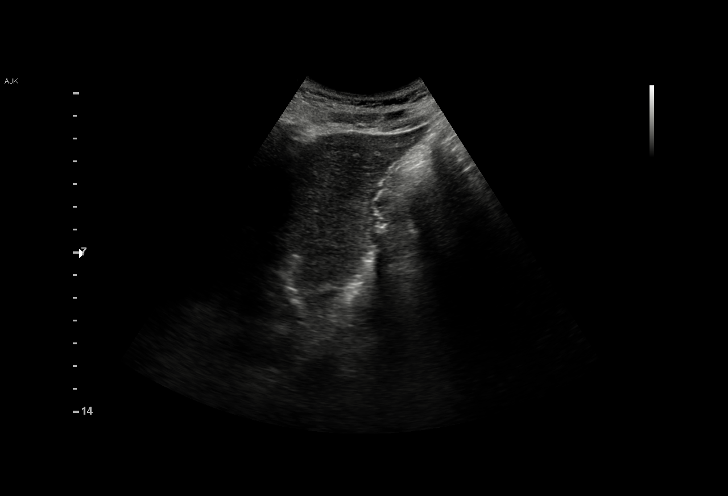
[im 3/35]
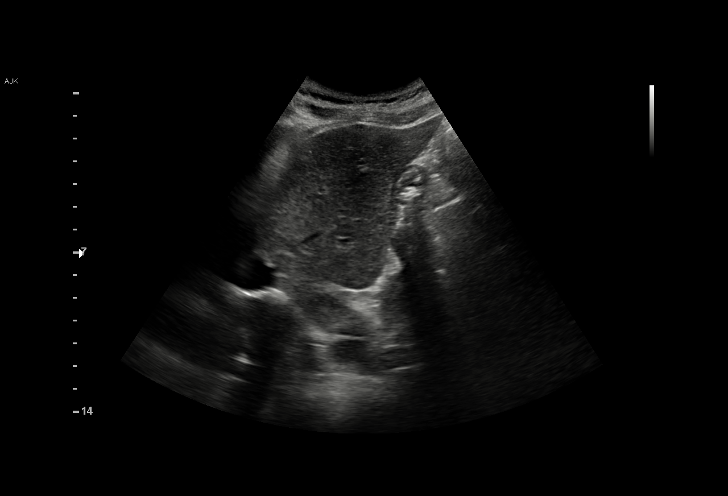
[im 6/35]
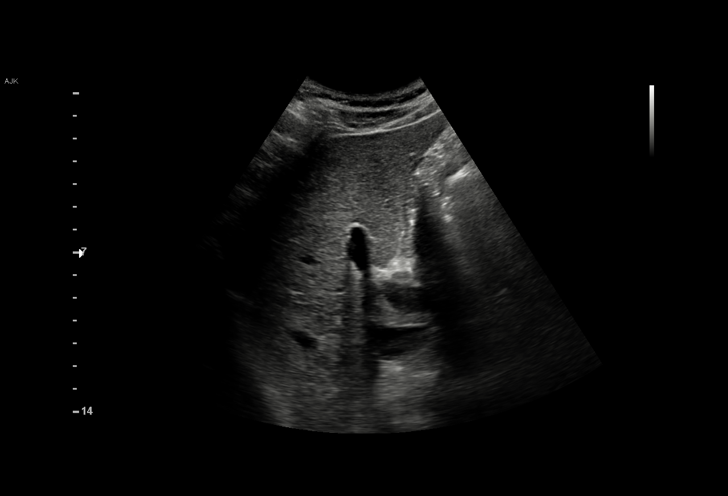
[im 8/35]
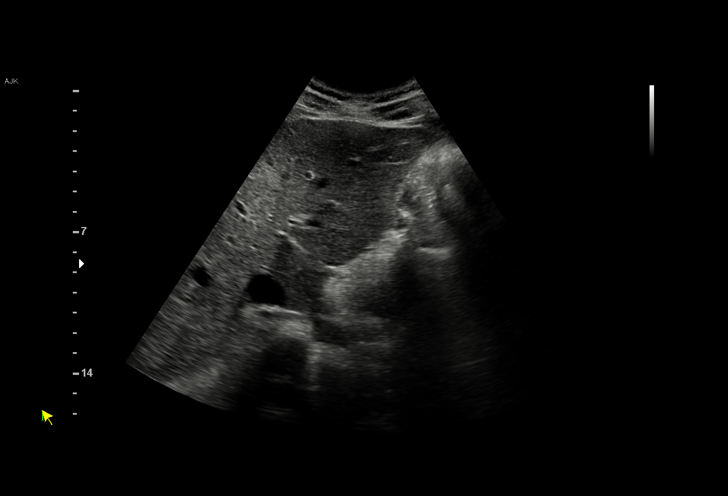
[im 10/35]
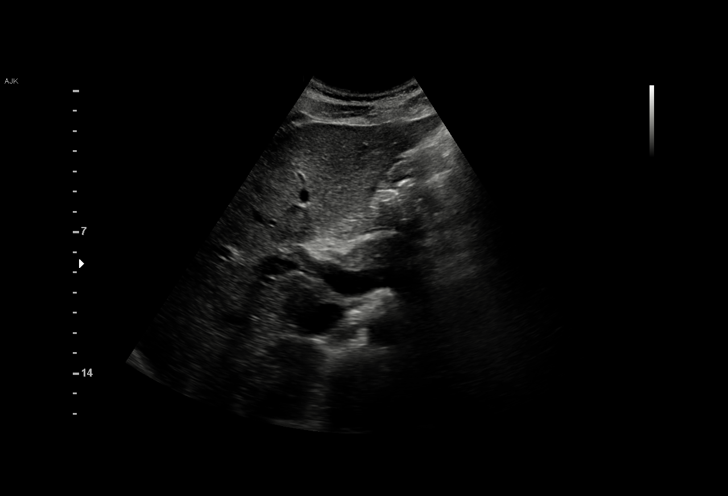
[im 13/35]
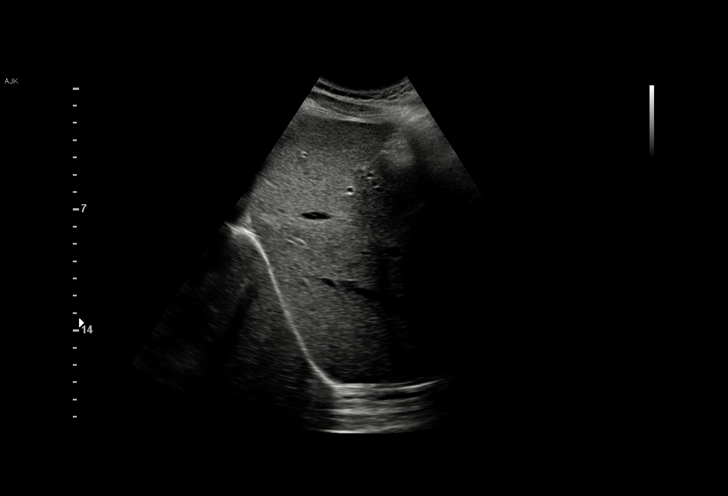
[im 15/35]
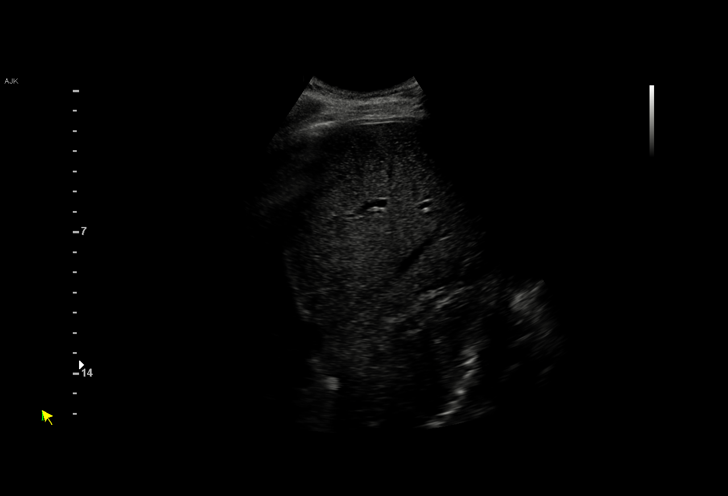
[im 18/35]
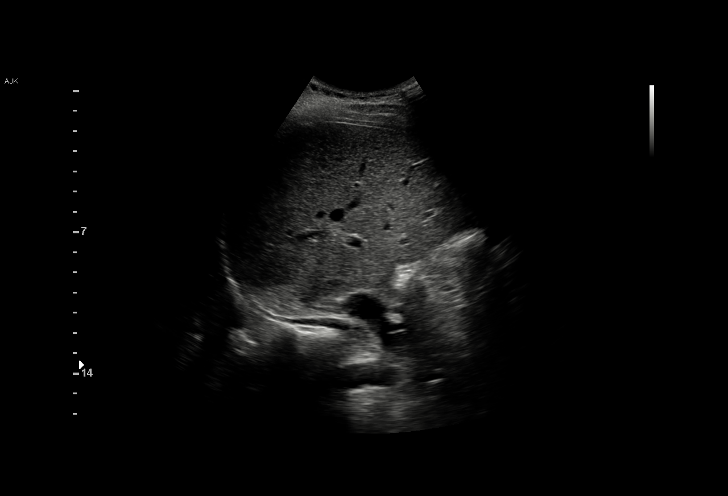
[im 20/35]
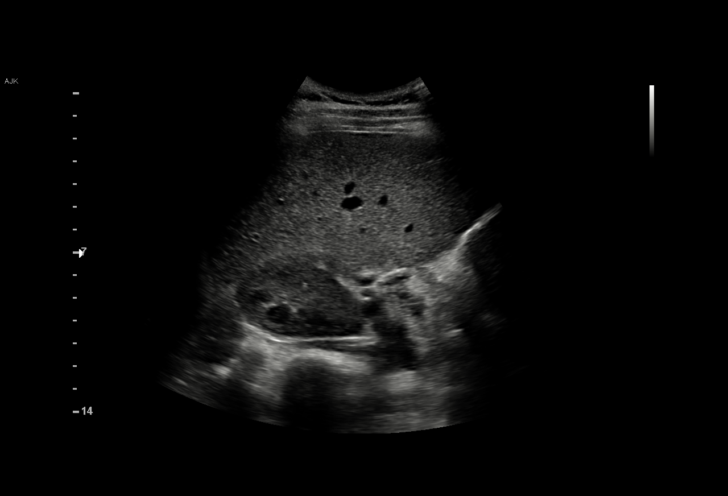
[im 22/35]
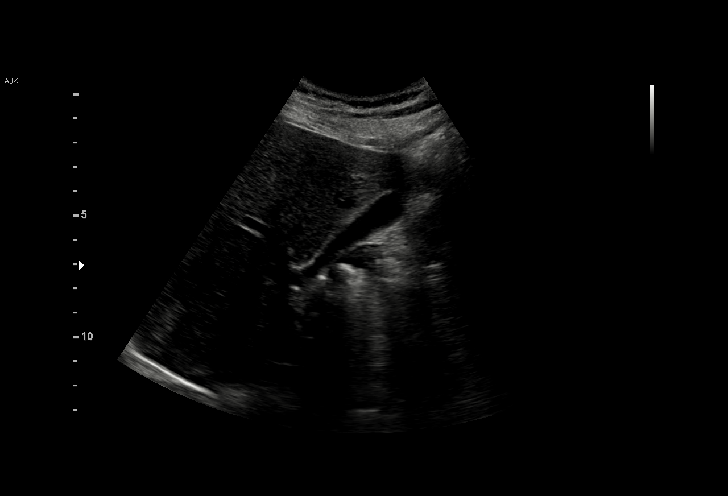
[im 25/35]
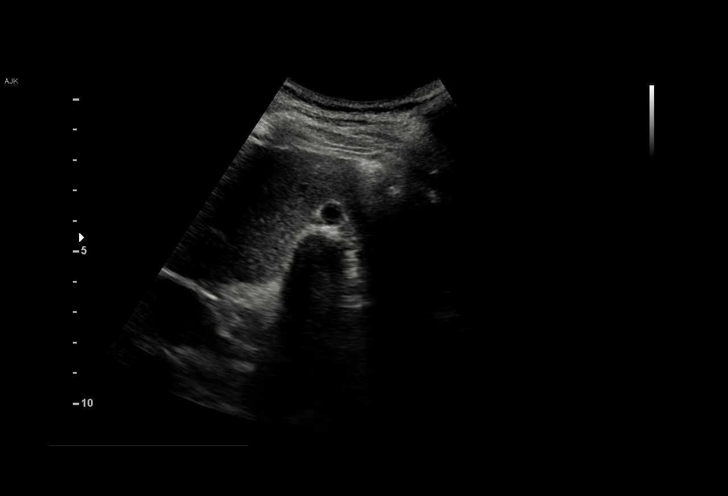
[im 27/35]
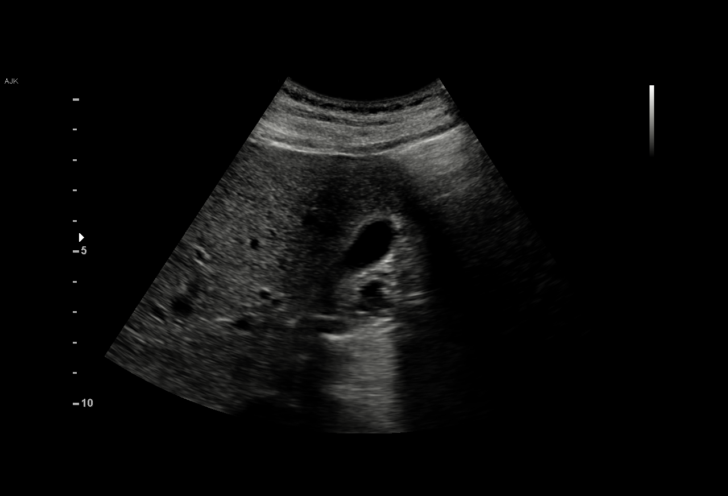
[im 29/35]
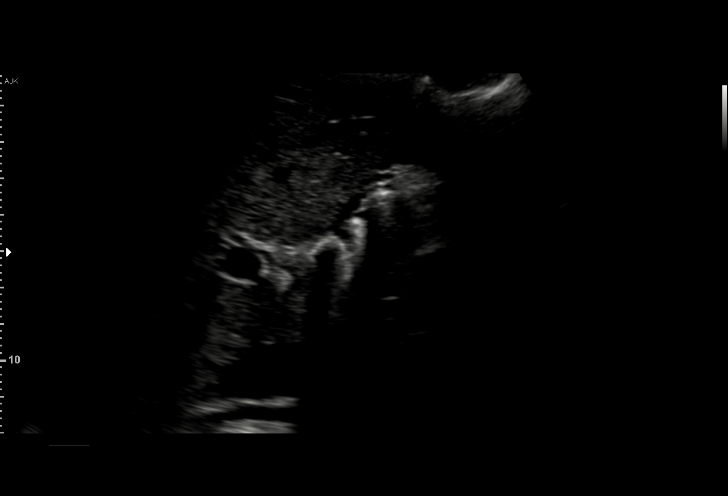
[im 32/35]
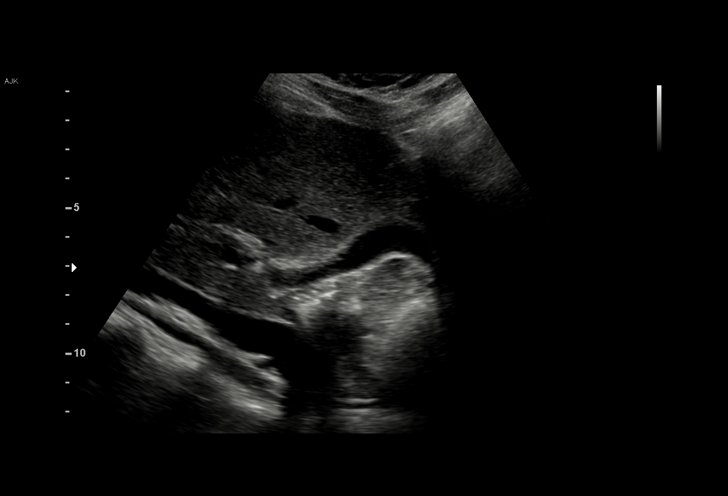
[im 35/35]
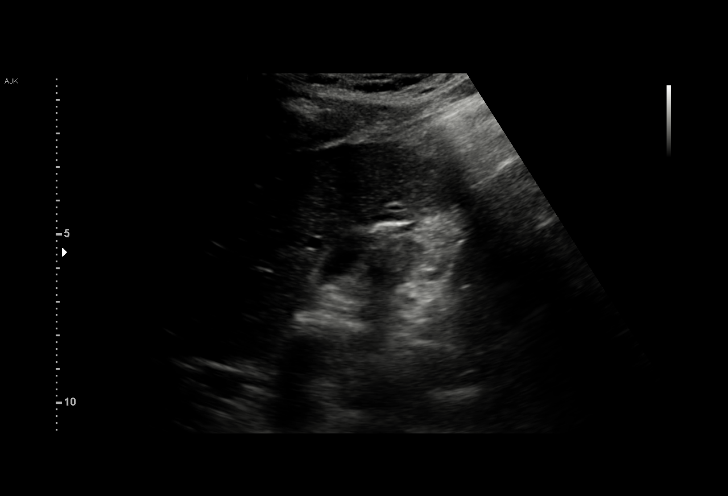

[15 of 25 positions shown; findings below may reference images not displayed]

FINDINGS: Gallbladder:

No gallstones, gallbladder wall thickening, or pericholecystic
fluid. Negative sonographic Murphy's sign.

Common bile duct:

Diameter: 2 mm

Liver:

No focal lesion identified. Within normal limits in parenchymal
echogenicity. Portal vein is patent on color Doppler imaging with
normal direction of blood flow towards the liver.
IMPRESSION: Negative right upper quadrant ultrasound.

## 2020-02-26 IMAGING — MR MR PELVIS W/O CM
9 series · 37 of 48 positions shown · non-contrast
Comparison: Abdominal ultrasound 05/09/2018

CLINICAL DATA: Third trimester pregnancy, right upper quadrant pain
with intermittent contractions starting earlier today. Leukocytosis
with left shift.

EXAM:
MRI ABDOMEN AND PELVIS WITHOUT CONTRAST
TECHNIQUE: Multiplanar multisequence MR imaging of the abdomen and pelvis was
performed. No intravenous contrast was administered. Today's exam
was protocol for maternal assessment

[Series 3: cor haste · coronal · 6.0mm · 1.22mm/px · 3 of 30 slices shown]
[im 1/30]
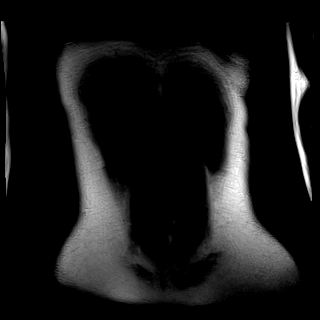
[im 15/30]
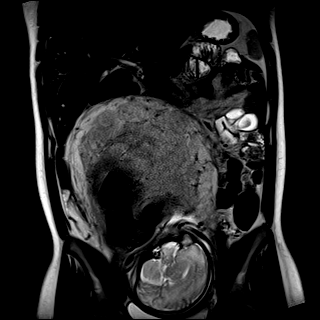
[im 30/30]
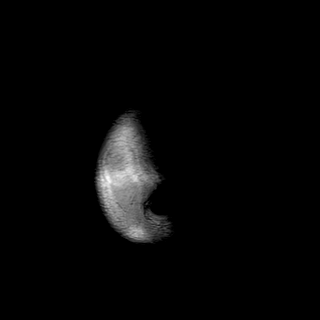

[Series 6: ax haste_comp · axial · 5.0mm · 1.19mm/px · z∈[-218,+164]mm · 6 of 64 slices shown]
[im 1/64]
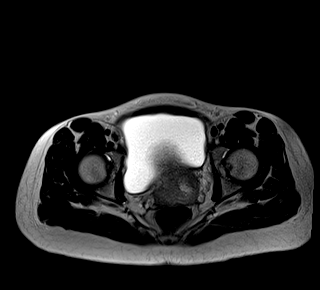
[im 13/64]
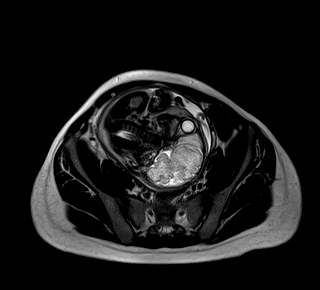
[im 26/64]
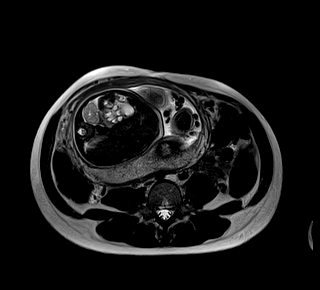
[im 38/64]
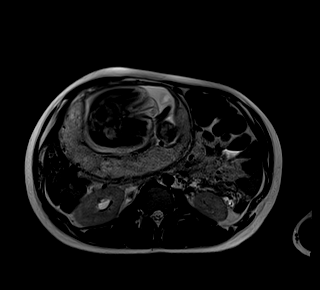
[im 51/64]
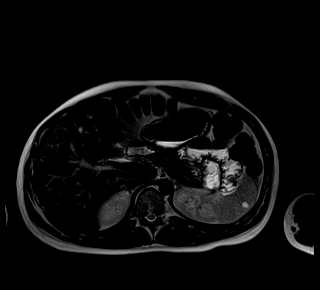
[im 64/64]
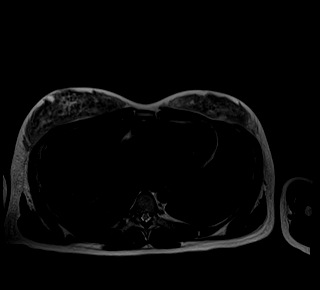

[Series 7: cor haste fs · coronal · 6.0mm · 1.31mm/px · 3 of 30 slices shown]
[im 1/30]
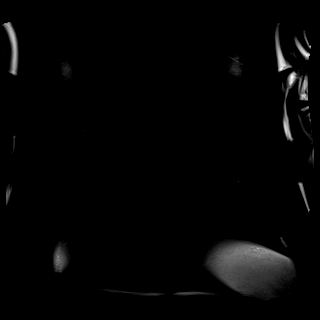
[im 15/30]
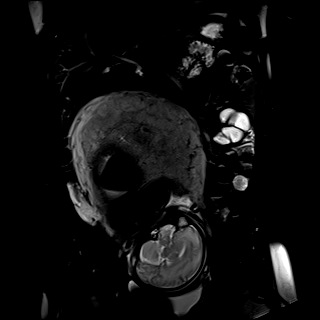
[im 30/30]
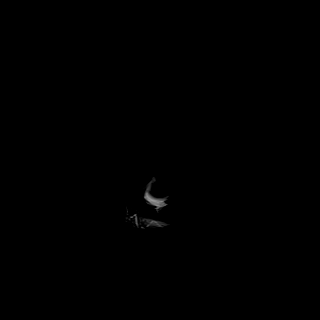

[Series 8: bSSFP · coronal · 6.0mm · 0.82mm/px · 3 of 30 slices shown (1 of 2)]
[im 1/30]
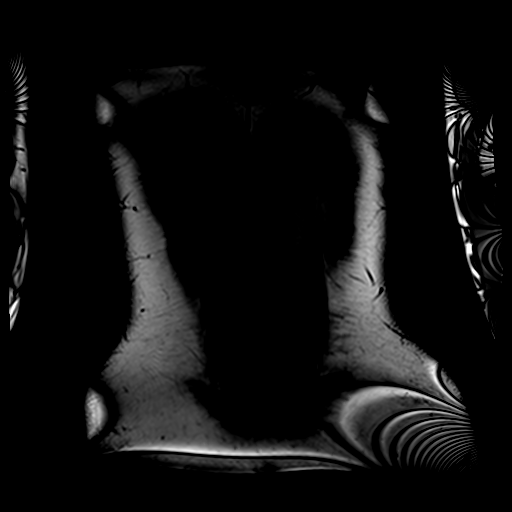
[im 15/30]
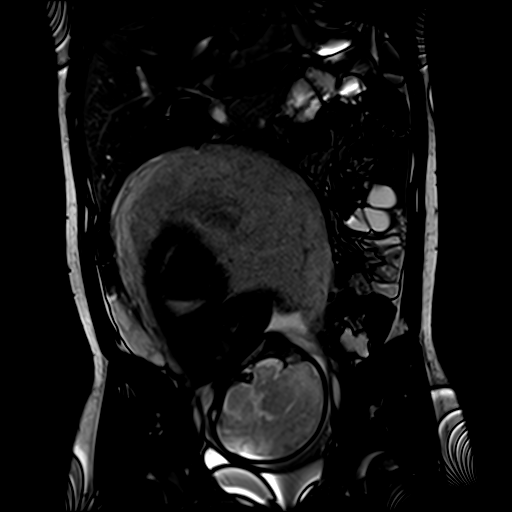
[im 30/30]
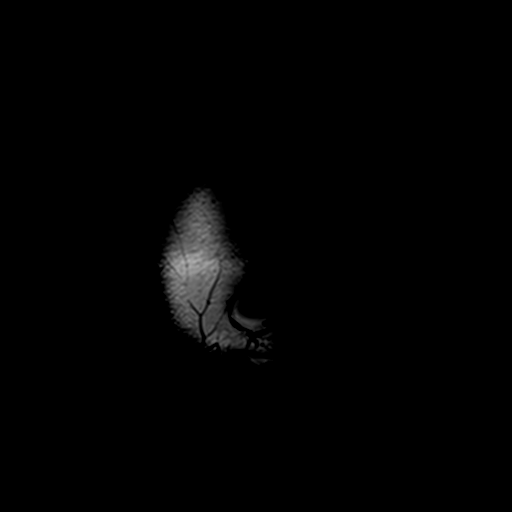

[Series 17: T2 fat-sat · axial · 5.0mm · 1.19mm/px · z∈[-42,+161]mm · 3 of 35 slices shown]
[im 1/35]
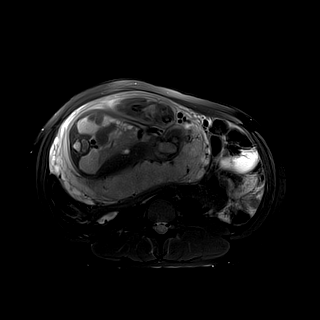
[im 18/35]
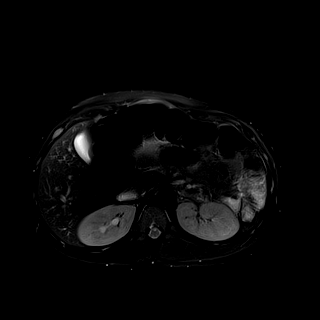
[im 35/35]
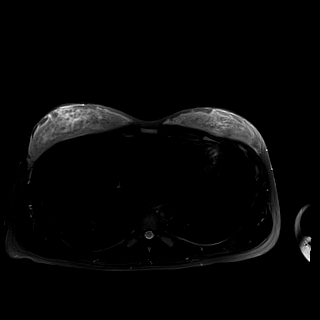

[Series 23: bSSFP · axial · 5.0mm · 0.74mm/px · z∈[-258,+157]mm · 7 of 70 slices shown (2 of 2)]
[im 1/70]
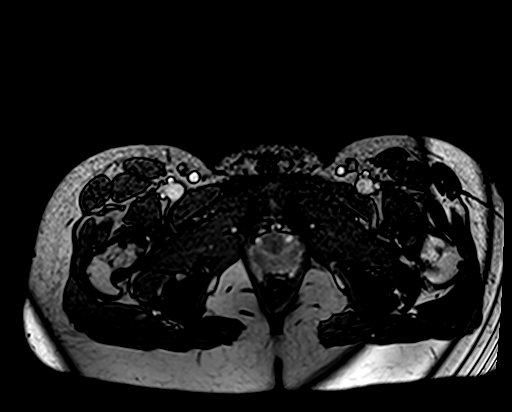
[im 12/70]
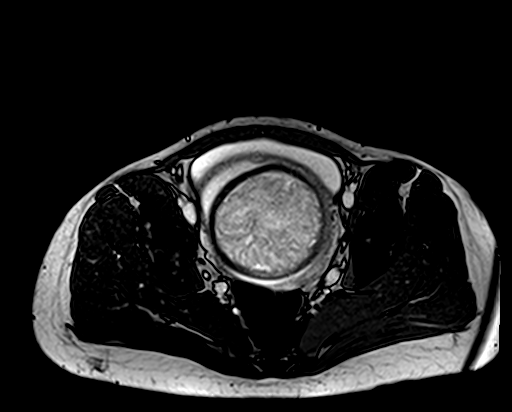
[im 24/70]
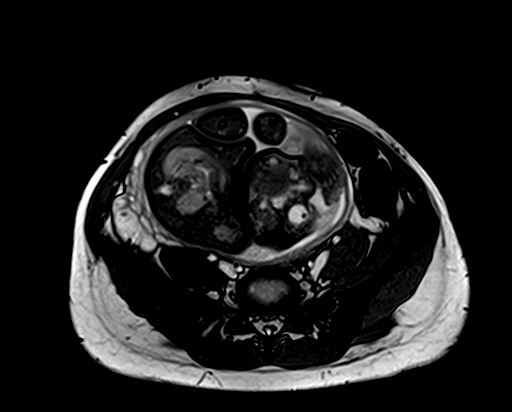
[im 35/70]
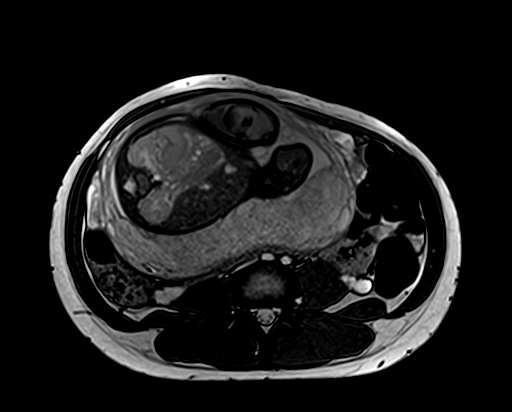
[im 47/70]
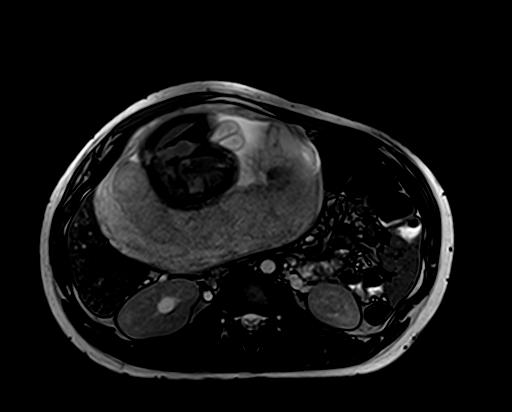
[im 58/70]
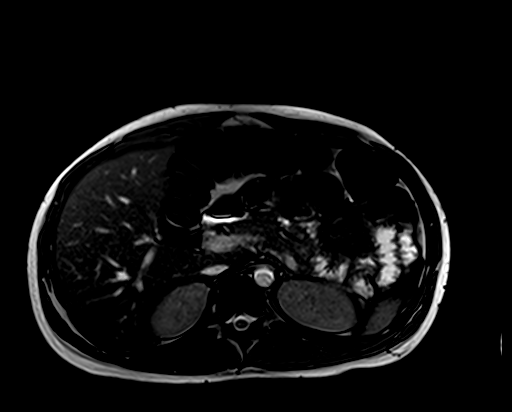
[im 70/70]
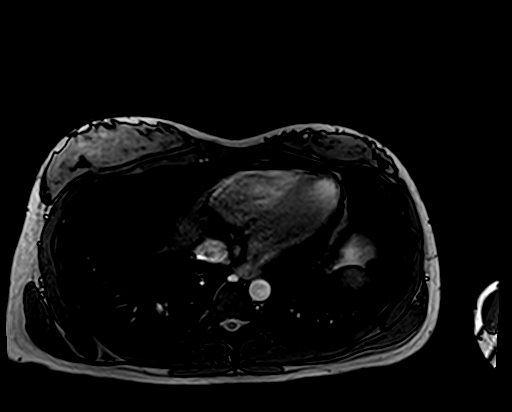

[Series 26: T1 · axial · 6.0mm · 1.48mm/px · z∈[-278,+130]mm · 5 of 58 slices shown]
[im 1/58]
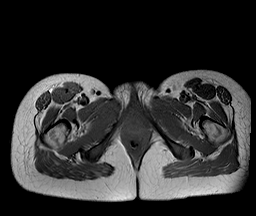
[im 15/58]
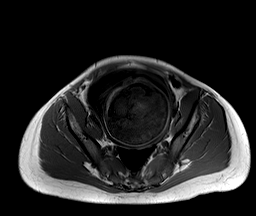
[im 29/58]
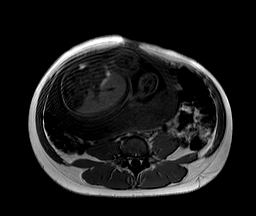
[im 43/58]
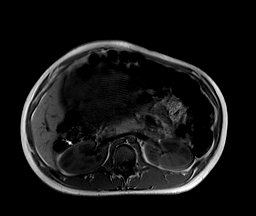
[im 58/58]
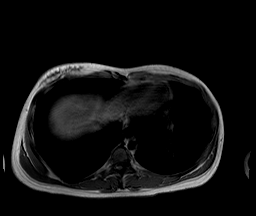

[Series 37: t2_blade_fs_tra_p3_mbh · axial · 6.0mm · 1.48mm/px · z∈[-270,-62]mm · 3 of 30 slices shown]
[im 1/30]
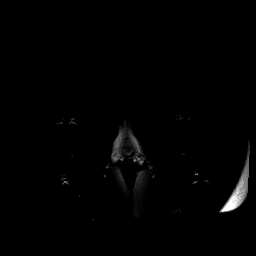
[im 15/30]
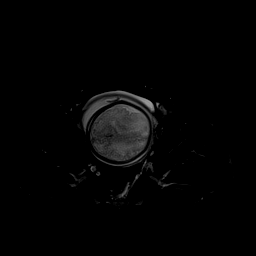
[im 30/30]
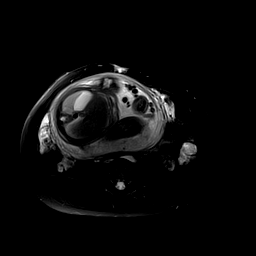

[Series 41: T1 dynamic · axial · 3.0mm · 1.41mm/px · z∈[-295,-91]mm · 4 of 160 slices shown]
[im 1/160]
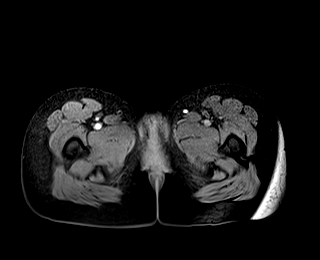
[im 23/160]
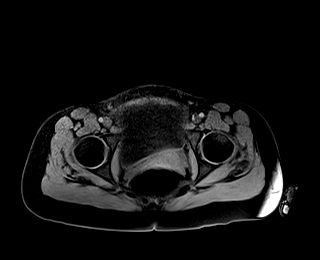
[im 46/160]
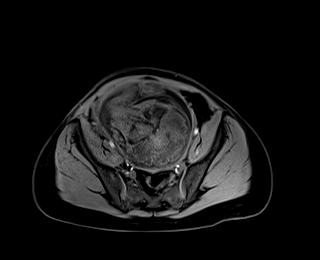
[im 69/160]
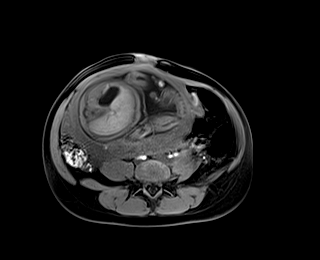

[37 of 48 positions shown; findings below may reference images not displayed]

FINDINGS: COMBINED FINDINGS FOR BOTH MR ABDOMEN AND PELVIS

Lower chest: Unremarkable

Hepatobiliary: Unremarkable. The gallbladder appears normal. No
biliary dilatation.

Pancreas:  Unremarkable

Spleen: Sharply defined 1.0 by 0.7 cm fluid signal intensity lesion
in the spleen

Adrenals/Urinary Tract: Adrenal glands normal. Mild right
hydronephrosis without overt hydroureter. Ureter passes just behind
the somewhat prominent right gonadal vein just below the UPJ. No
appreciable focal renal abnormality

Stomach/Bowel: Displacement of bowel by the gravid uterus. No
dilated bowel or specific bowel abnormality observed. The appendix
is not confidently identified but no definite right lower quadrant
inflammatory process is observed. Parametrial vasculature and ovary
are present in the right lower quadrant just below the cecum.

Vascular/Lymphatic:  Unremarkable

Reproductive: Single intrauterine pregnancy noted in cephalic
presentation, placenta posterior and fundal without previa. The
cervix is closed. No appreciable maternal ovarian abnormality.

Other:  Unremarkable

Musculoskeletal: Unremarkable
IMPRESSION: 1. There is mild right hydronephrosis without significant
hydroureter, suggesting mild narrowing at the right ureteropelvic
junction. Some of this could be related to mass effect from the
adjacent gravid uterus. No signal abnormality in the renal
parenchyma to suggest pyelonephritis.
2. A specific cause for the patient's leukocytosis is not
identified. Although the appendix is not confidently seen, I do not
see a definite right lower quadrant inflammatory process. The
gonadal vasculature and ovary are below the cecum.
3. No specific complicating feature related to the fetus is
identified. The placenta is posterior and fundal with the fetus in
cephalic presentation.
4. A 1.0 cm fluid signal intensity lesion of the spleen is noted
without surrounding abnormal signal alterations in the spleen. This
is probably a small cyst or possibly a hemangioma, and is unlikely
to be clinically consequential.

## 2020-03-06 DIAGNOSIS — Z419 Encounter for procedure for purposes other than remedying health state, unspecified: Secondary | ICD-10-CM | POA: Diagnosis not present

## 2020-04-06 DIAGNOSIS — Z419 Encounter for procedure for purposes other than remedying health state, unspecified: Secondary | ICD-10-CM | POA: Diagnosis not present

## 2020-05-06 DIAGNOSIS — Z419 Encounter for procedure for purposes other than remedying health state, unspecified: Secondary | ICD-10-CM | POA: Diagnosis not present

## 2020-05-14 DIAGNOSIS — R112 Nausea with vomiting, unspecified: Secondary | ICD-10-CM | POA: Diagnosis not present

## 2020-05-14 DIAGNOSIS — Z20822 Contact with and (suspected) exposure to covid-19: Secondary | ICD-10-CM | POA: Diagnosis not present

## 2020-06-06 DIAGNOSIS — Z419 Encounter for procedure for purposes other than remedying health state, unspecified: Secondary | ICD-10-CM | POA: Diagnosis not present

## 2020-07-06 DIAGNOSIS — Z419 Encounter for procedure for purposes other than remedying health state, unspecified: Secondary | ICD-10-CM | POA: Diagnosis not present

## 2020-07-06 DIAGNOSIS — Z30013 Encounter for initial prescription of injectable contraceptive: Secondary | ICD-10-CM | POA: Diagnosis not present

## 2020-07-06 DIAGNOSIS — Z3202 Encounter for pregnancy test, result negative: Secondary | ICD-10-CM | POA: Diagnosis not present

## 2020-08-06 DIAGNOSIS — Z419 Encounter for procedure for purposes other than remedying health state, unspecified: Secondary | ICD-10-CM | POA: Diagnosis not present

## 2020-08-24 DIAGNOSIS — U071 COVID-19: Secondary | ICD-10-CM | POA: Diagnosis not present

## 2020-08-24 DIAGNOSIS — Z20828 Contact with and (suspected) exposure to other viral communicable diseases: Secondary | ICD-10-CM | POA: Diagnosis not present

## 2020-09-06 DIAGNOSIS — Z419 Encounter for procedure for purposes other than remedying health state, unspecified: Secondary | ICD-10-CM | POA: Diagnosis not present

## 2020-10-04 DIAGNOSIS — Z419 Encounter for procedure for purposes other than remedying health state, unspecified: Secondary | ICD-10-CM | POA: Diagnosis not present

## 2020-11-04 DIAGNOSIS — Z419 Encounter for procedure for purposes other than remedying health state, unspecified: Secondary | ICD-10-CM | POA: Diagnosis not present

## 2020-12-04 DIAGNOSIS — Z419 Encounter for procedure for purposes other than remedying health state, unspecified: Secondary | ICD-10-CM | POA: Diagnosis not present

## 2021-01-04 DIAGNOSIS — Z419 Encounter for procedure for purposes other than remedying health state, unspecified: Secondary | ICD-10-CM | POA: Diagnosis not present

## 2021-02-03 DIAGNOSIS — Z419 Encounter for procedure for purposes other than remedying health state, unspecified: Secondary | ICD-10-CM | POA: Diagnosis not present

## 2021-02-16 DIAGNOSIS — F17200 Nicotine dependence, unspecified, uncomplicated: Secondary | ICD-10-CM | POA: Diagnosis not present

## 2021-02-16 DIAGNOSIS — X500XXA Overexertion from strenuous movement or load, initial encounter: Secondary | ICD-10-CM | POA: Diagnosis not present

## 2021-02-16 DIAGNOSIS — S39012A Strain of muscle, fascia and tendon of lower back, initial encounter: Secondary | ICD-10-CM | POA: Diagnosis not present

## 2021-03-06 DIAGNOSIS — Z419 Encounter for procedure for purposes other than remedying health state, unspecified: Secondary | ICD-10-CM | POA: Diagnosis not present

## 2021-03-10 DIAGNOSIS — N939 Abnormal uterine and vaginal bleeding, unspecified: Secondary | ICD-10-CM | POA: Diagnosis not present

## 2021-03-10 DIAGNOSIS — Z30016 Encounter for initial prescription of transdermal patch hormonal contraceptive device: Secondary | ICD-10-CM | POA: Diagnosis not present

## 2021-03-30 DIAGNOSIS — F331 Major depressive disorder, recurrent, moderate: Secondary | ICD-10-CM | POA: Diagnosis not present

## 2021-04-06 DIAGNOSIS — Z419 Encounter for procedure for purposes other than remedying health state, unspecified: Secondary | ICD-10-CM | POA: Diagnosis not present

## 2021-04-24 DIAGNOSIS — Z6821 Body mass index (BMI) 21.0-21.9, adult: Secondary | ICD-10-CM | POA: Diagnosis not present

## 2021-04-24 DIAGNOSIS — R630 Anorexia: Secondary | ICD-10-CM | POA: Diagnosis not present

## 2021-04-24 DIAGNOSIS — Z7689 Persons encountering health services in other specified circumstances: Secondary | ICD-10-CM | POA: Diagnosis not present

## 2021-04-24 DIAGNOSIS — Z1329 Encounter for screening for other suspected endocrine disorder: Secondary | ICD-10-CM | POA: Diagnosis not present

## 2021-04-24 DIAGNOSIS — Z1321 Encounter for screening for nutritional disorder: Secondary | ICD-10-CM | POA: Diagnosis not present

## 2021-04-24 DIAGNOSIS — Z3202 Encounter for pregnancy test, result negative: Secondary | ICD-10-CM | POA: Diagnosis not present

## 2021-04-24 DIAGNOSIS — Z309 Encounter for contraceptive management, unspecified: Secondary | ICD-10-CM | POA: Diagnosis not present

## 2021-04-27 DIAGNOSIS — Z3042 Encounter for surveillance of injectable contraceptive: Secondary | ICD-10-CM | POA: Diagnosis not present

## 2021-04-27 DIAGNOSIS — Z3202 Encounter for pregnancy test, result negative: Secondary | ICD-10-CM | POA: Diagnosis not present

## 2021-05-06 DIAGNOSIS — Z419 Encounter for procedure for purposes other than remedying health state, unspecified: Secondary | ICD-10-CM | POA: Diagnosis not present

## 2021-06-06 DIAGNOSIS — Z419 Encounter for procedure for purposes other than remedying health state, unspecified: Secondary | ICD-10-CM | POA: Diagnosis not present

## 2021-06-09 ENCOUNTER — Other Ambulatory Visit: Payer: Self-pay

## 2021-07-06 DIAGNOSIS — Z419 Encounter for procedure for purposes other than remedying health state, unspecified: Secondary | ICD-10-CM | POA: Diagnosis not present

## 2021-07-10 DIAGNOSIS — Z3042 Encounter for surveillance of injectable contraceptive: Secondary | ICD-10-CM | POA: Diagnosis not present

## 2021-08-06 DIAGNOSIS — Z419 Encounter for procedure for purposes other than remedying health state, unspecified: Secondary | ICD-10-CM | POA: Diagnosis not present

## 2021-08-30 DIAGNOSIS — N939 Abnormal uterine and vaginal bleeding, unspecified: Secondary | ICD-10-CM | POA: Diagnosis not present

## 2021-08-30 DIAGNOSIS — Z30011 Encounter for initial prescription of contraceptive pills: Secondary | ICD-10-CM | POA: Diagnosis not present

## 2021-09-06 DIAGNOSIS — Z419 Encounter for procedure for purposes other than remedying health state, unspecified: Secondary | ICD-10-CM | POA: Diagnosis not present

## 2021-10-04 DIAGNOSIS — Z419 Encounter for procedure for purposes other than remedying health state, unspecified: Secondary | ICD-10-CM | POA: Diagnosis not present

## 2021-11-04 DIAGNOSIS — Z419 Encounter for procedure for purposes other than remedying health state, unspecified: Secondary | ICD-10-CM | POA: Diagnosis not present

## 2021-12-04 DIAGNOSIS — Z419 Encounter for procedure for purposes other than remedying health state, unspecified: Secondary | ICD-10-CM | POA: Diagnosis not present

## 2022-01-04 DIAGNOSIS — Z419 Encounter for procedure for purposes other than remedying health state, unspecified: Secondary | ICD-10-CM | POA: Diagnosis not present

## 2022-02-03 DIAGNOSIS — Z419 Encounter for procedure for purposes other than remedying health state, unspecified: Secondary | ICD-10-CM | POA: Diagnosis not present

## 2022-03-06 DIAGNOSIS — Z419 Encounter for procedure for purposes other than remedying health state, unspecified: Secondary | ICD-10-CM | POA: Diagnosis not present

## 2022-04-06 DIAGNOSIS — Z419 Encounter for procedure for purposes other than remedying health state, unspecified: Secondary | ICD-10-CM | POA: Diagnosis not present

## 2022-05-06 DIAGNOSIS — Z419 Encounter for procedure for purposes other than remedying health state, unspecified: Secondary | ICD-10-CM | POA: Diagnosis not present

## 2022-05-07 DIAGNOSIS — Z113 Encounter for screening for infections with a predominantly sexual mode of transmission: Secondary | ICD-10-CM | POA: Diagnosis not present

## 2022-05-27 DIAGNOSIS — Z3202 Encounter for pregnancy test, result negative: Secondary | ICD-10-CM | POA: Diagnosis not present

## 2022-06-06 DIAGNOSIS — Z419 Encounter for procedure for purposes other than remedying health state, unspecified: Secondary | ICD-10-CM | POA: Diagnosis not present

## 2022-07-06 DIAGNOSIS — Z419 Encounter for procedure for purposes other than remedying health state, unspecified: Secondary | ICD-10-CM | POA: Diagnosis not present

## 2022-08-01 DIAGNOSIS — R6889 Other general symptoms and signs: Secondary | ICD-10-CM | POA: Diagnosis not present

## 2022-08-01 DIAGNOSIS — Z20828 Contact with and (suspected) exposure to other viral communicable diseases: Secondary | ICD-10-CM | POA: Diagnosis not present

## 2022-08-01 DIAGNOSIS — J069 Acute upper respiratory infection, unspecified: Secondary | ICD-10-CM | POA: Diagnosis not present

## 2022-08-01 DIAGNOSIS — Z3202 Encounter for pregnancy test, result negative: Secondary | ICD-10-CM | POA: Diagnosis not present

## 2022-08-01 DIAGNOSIS — Z202 Contact with and (suspected) exposure to infections with a predominantly sexual mode of transmission: Secondary | ICD-10-CM | POA: Diagnosis not present

## 2022-08-01 DIAGNOSIS — R300 Dysuria: Secondary | ICD-10-CM | POA: Diagnosis not present

## 2022-08-18 DIAGNOSIS — Z20822 Contact with and (suspected) exposure to covid-19: Secondary | ICD-10-CM | POA: Diagnosis not present

## 2022-08-18 DIAGNOSIS — R059 Cough, unspecified: Secondary | ICD-10-CM | POA: Diagnosis not present

## 2022-08-18 DIAGNOSIS — J101 Influenza due to other identified influenza virus with other respiratory manifestations: Secondary | ICD-10-CM | POA: Diagnosis not present

## 2022-08-18 DIAGNOSIS — Z3202 Encounter for pregnancy test, result negative: Secondary | ICD-10-CM | POA: Diagnosis not present

## 2022-08-18 DIAGNOSIS — Z113 Encounter for screening for infections with a predominantly sexual mode of transmission: Secondary | ICD-10-CM | POA: Diagnosis not present

## 2022-11-16 ENCOUNTER — Ambulatory Visit (INDEPENDENT_AMBULATORY_CARE_PROVIDER_SITE_OTHER): Payer: Medicaid Other | Admitting: Adult Health

## 2022-11-16 ENCOUNTER — Encounter: Payer: Self-pay | Admitting: Adult Health

## 2022-11-16 VITALS — BP 122/78 | HR 67 | Ht 64.0 in | Wt 128.0 lb

## 2022-11-16 DIAGNOSIS — Z3201 Encounter for pregnancy test, result positive: Secondary | ICD-10-CM | POA: Insufficient documentation

## 2022-11-16 DIAGNOSIS — Z3A01 Less than 8 weeks gestation of pregnancy: Secondary | ICD-10-CM | POA: Insufficient documentation

## 2022-11-16 DIAGNOSIS — Z98891 History of uterine scar from previous surgery: Secondary | ICD-10-CM

## 2022-11-16 DIAGNOSIS — R110 Nausea: Secondary | ICD-10-CM

## 2022-11-16 DIAGNOSIS — O3680X0 Pregnancy with inconclusive fetal viability, not applicable or unspecified: Secondary | ICD-10-CM

## 2022-11-16 DIAGNOSIS — O09299 Supervision of pregnancy with other poor reproductive or obstetric history, unspecified trimester: Secondary | ICD-10-CM

## 2022-11-16 DIAGNOSIS — O09291 Supervision of pregnancy with other poor reproductive or obstetric history, first trimester: Secondary | ICD-10-CM

## 2022-11-16 LAB — POCT URINE PREGNANCY: Preg Test, Ur: POSITIVE — AB

## 2022-11-16 MED ORDER — PROMETHAZINE HCL 12.5 MG PO TABS: 12.5000 mg | ORAL_TABLET | Freq: Four times a day (QID) | ORAL | 1 refills | Status: AC | PRN

## 2022-11-16 NOTE — Progress Notes (Signed)
Subjective:     Patient ID: Brittney Wyatt, female   DOB: [birth year], 27 y.o.   MRN: [id number]  HPI Brittney Wyatt is a 27 year old black female, single, G3P2002 in for UPT, has missed a period and had +HPT, and has nausea. She says she had pre eclampsia with first and  C section with second for breech.   Review of Systems +missed period +nausea Has some constipation, increase water, juice and fruit and fiber  Reviewed past medical,surgical, social and family history. Reviewed medications and allergies.     Objective:   Physical Exam BP 122/78 (BP Location: Left Arm, Patient Position: Sitting, Cuff Size: Normal)   Pulse 67   Ht  (1.626 m)   Wt 128 lb (58.1 kg)   LMP 09/22/2022   BMI 21.97 kg/m  UPT is +, about 7+6 weeks by LMP with EDD 06/29/23. Skin warm and dry. Neck: mid line trachea, normal thyroid, good ROM, no lymphadenopathy noted. Lungs: clear to ausculation bilaterally. Cardiovascular: regular rate and rhythm.  AA is 0 Fall risk is low    11/16/2022   11:51 AM 06/18/2018    1:38 PM 06/11/2018    3:04 PM  Depression screen PHQ 2/9  Decreased Interest 1 0 1  Down, Depressed, Hopeless 0 0 0  PHQ - 2 Score 1 0 1  Altered sleeping 0 0 0  Tired, decreased energy 1 0 1  Change in appetite 3 0 0  Feeling bad or failure about yourself  0 0 0  Trouble concentrating 0 0 0  Moving slowly or fidgety/restless 0 0 0  Suicidal thoughts 0 0 0  PHQ-9 Score 5 0 2       11/16/2022   11:52 AM 06/18/2018    1:37 PM 06/11/2018    3:05 PM 05/27/2018    1:25 PM  GAD 7 : Generalized Anxiety Score  Nervous, Anxious, on Edge 0 0 0 0  Control/stop worrying 0 0 0 1  Worry too much - different things 0 0 0 1  Trouble relaxing 0 Restless 0 0 0 0  Easily annoyed or irritable Afraid - awful might happen 0 Total GAD 7 Score Upstream - 11/16/22 1207       Pregnancy Intention Screening   Does the patient want to become pregnant in the next  year? N/A    Does the patient's partner want to become pregnant in the next year? N/A    Would the patient like to discuss contraceptive options today? No      Contraception Wrap Up   Current Method Pregnant/Seeking Pregnancy    End Method Pregnant/Seeking Pregnancy    Contraception Counseling Provided No                Assessment:     1. Pregnancy examination or test, positive result - POCT urine pregnancy  2. Less than [redacted] weeks gestation of pregnancy Get OTC PNV Gummies  3. Encounter to determine fetal viability of pregnancy, single or unspecified fetus Return in about 2 weeks for dating [place] - US OB Comp Less 14 Wks; Future  4. Nausea Wll rx phenergan  Meds ordered this encounter  Medications   promethazine (PHENERGAN) 12.5 MG tablet    Sig: Take 1 tablet (12.5 mg total) by mouth every 6 (six) hours as needed.    Dispense:  30 tablet  Refill:  1    Order Specific Question:   Supervising Provider    Answer:   Despina Hidden, LUTHER H [[id number]]     5. History of pre-eclampsia in prior pregnancy, currently pregnant  6. History of cesarean section Baby was breech     Plan:     Review OB packet

## 2022-11-28 ENCOUNTER — Telehealth: Payer: Self-pay

## 2022-11-28 NOTE — Telephone Encounter (Signed)
Left message to patient to see if she had found a ob doctor to continue her care that was in network with her insurance and to see if we could cancel the ultrasound she has here on 4/30.

## 2022-12-04 ENCOUNTER — Other Ambulatory Visit: Payer: Medicaid Other

## 2023-02-10 ENCOUNTER — Emergency Department (HOSPITAL_COMMUNITY)
Admission: EM | Admit: 2023-02-10 | Discharge: 2023-02-10 | Disposition: A | Discharge status: Home / Self Care | Payer: Medicaid Other | Attending: Emergency Medicine | Admitting: Emergency Medicine

## 2023-02-10 ENCOUNTER — Other Ambulatory Visit: Payer: Self-pay

## 2023-02-10 ENCOUNTER — Emergency Department (HOSPITAL_COMMUNITY): Payer: Medicaid Other

## 2023-02-10 DIAGNOSIS — J029 Acute pharyngitis, unspecified: Secondary | ICD-10-CM | POA: Diagnosis not present

## 2023-02-10 DIAGNOSIS — M542 Cervicalgia: Secondary | ICD-10-CM | POA: Diagnosis not present

## 2023-02-10 DIAGNOSIS — Z7982 Long term (current) use of aspirin: Secondary | ICD-10-CM | POA: Insufficient documentation

## 2023-02-10 LAB — GROUP A STREP BY PCR: Group A Strep by PCR: NOT DETECTED

## 2023-02-10 MED ORDER — LIDOCAINE VISCOUS HCL 2 % MT SOLN
15.0000 mL | Freq: Once | OROMUCOSAL | Status: AC
  Administered 2023-02-10: 15 mL via OROMUCOSAL
  Filled 2023-02-10: qty 15

## 2023-02-10 MED ORDER — IBUPROFEN 800 MG PO TABS
800.0000 mg | ORAL_TABLET | Freq: Once | ORAL | Status: AC
  Administered 2023-02-10: 800 mg via ORAL
  Filled 2023-02-10: qty 1

## 2023-02-10 MED ORDER — NAPROXEN 500 MG PO TABS: 500.0000 mg | ORAL_TABLET | Freq: Two times a day (BID) | ORAL | 0 refills | Status: AC | PRN

## 2023-02-10 MED ORDER — LIDOCAINE VISCOUS HCL 2 % MT SOLN: 15.0000 mL | OROMUCOSAL | 0 refills | Status: AC | PRN

## 2023-02-10 NOTE — ED Provider Notes (Signed)
[place] EMERGENCY DEPARTMENT AT [facility] Provider Note   CSN: [id number] Arrival date & time: 02/10/23  1622     History  Chief Complaint  Patient presents with   Neck Pain   Sore Throat    Brittney Wyatt is a 26 y.o. female with a history of anemia who presents to the ED today for neck pain and sore throat. Patient reports that for the past 3-4 weeks she has sharp, stabbing pains from her neck down her shoulders bilaterally, without impairment to range of motion or loss of sensation. She has taken Aspirin for pain without relief. Denies any fever, vomiting, or diarrhea. No recent illness or sick contact.    Home Medications Prior to Admission medications   Medication Sig Start Date End Date Taking? Authorizing Provider  aspirin EC 81 MG tablet Take 81 mg by mouth every 6 (six) hours as needed for mild pain. Swallow whole.   Yes [provider]  lidocaine (XYLOCAINE) 2 % solution Use as directed 15 mLs in the mouth or throat as needed for up to 5 days for mouth pain (As needed for sore throat). 02/10/23 02/15/23 Yes Maxwell Marion, PA-C  naproxen (NAPROSYN) 500 MG tablet Take 1 tablet (500 mg total) by mouth 2 (two) times daily as needed. 02/10/23 05/11/23 Yes Maxwell Marion, PA-C  promethazine (PHENERGAN) 12.5 MG tablet Take 1 tablet (12.5 mg total) by mouth every 6 (six) hours as needed. Patient not taking: Reported on 02/10/2023 11/16/22   Adline Potter, NP      Allergies    Patient has no known allergies.    Review of Systems   Review of Systems  HENT:  Positive for sore throat.   Musculoskeletal:  Positive for neck pain.  All other systems reviewed and are negative.   Physical Exam Updated Vital Signs BP 138/74 (BP Location: Right Arm)   Pulse 78   Temp 98.4 F (36.9 C) (Oral)   Resp 16   LMP 09/22/2022   SpO2 100%  Physical Exam Vitals and nursing note reviewed.  Constitutional:      General: She is not in acute distress.    Appearance:  Normal appearance.  HENT:     Head: Normocephalic and atraumatic.     Mouth/Throat:     Mouth: Mucous membranes are moist.     Pharynx: Oropharyngeal exudate and posterior oropharyngeal erythema present.     Comments: Edematous tonsils with exudates present Eyes:     Conjunctiva/sclera: Conjunctivae normal.     Pupils: Pupils are equal, round, and reactive to light.  Cardiovascular:     Rate and Rhythm: Normal rate and regular rhythm.     Pulses: Normal pulses.     Heart sounds: Normal heart sounds.  Pulmonary:     Effort: Pulmonary effort is normal.     Breath sounds: Normal breath sounds.  Abdominal:     Palpations: Abdomen is soft.     Tenderness: There is no abdominal tenderness.  Musculoskeletal:     Cervical back: Normal range of motion and neck supple.  Lymphadenopathy:     Cervical: Cervical adenopathy present.  Skin:    General: Skin is warm and dry.     Findings: No rash.  Neurological:     General: No focal deficit present.     Mental Status: She is alert.  Psychiatric:        Mood and Affect: Mood normal.        Behavior: Behavior normal.  ED Results / Procedures / Treatments   Labs (all labs ordered are listed, but only abnormal results are displayed) Labs Reviewed  GROUP A STREP BY PCR    EKG None  Radiology CT Cervical Spine Wo Contrast  Result Date: 02/10/2023 CLINICAL DATA:  Neck pain. Bilateral upper extremity pain. Sore throat. EXAM: CT CERVICAL SPINE WITHOUT CONTRAST TECHNIQUE: Multidetector CT imaging of the cervical spine was performed without intravenous contrast. Multiplanar CT image reconstructions were also generated. RADIATION DOSE REDUCTION: This exam was performed according to the departmental dose-optimization program which includes automated exposure control, adjustment of the mA and/or kV according to patient size and/or use of iterative reconstruction technique. COMPARISON:  None Available. FINDINGS: Alignment: Normal. Skull base and  vertebrae: No acute fracture. No primary bone lesion or focal pathologic process. Soft tissues and spinal canal: No prevertebral fluid or swelling. No visible canal hematoma. Disc levels: No significant central canal or neural foraminal stenosis at any level. Upper chest: Negative. Other: There is soft tissue prominence of the adenoids. IMPRESSION: 1. No acute fracture or static subluxation of the cervical spine. 2. Soft tissue prominence of the adenoids. Electronically Signed   By: Darliss Cheney M.D.   On: 02/10/2023 19:26    Procedures Procedures: not indicated.   Medications Ordered in ED Medications  lidocaine (XYLOCAINE) 2 % viscous mouth solution 15 mL (15 mLs Mouth/Throat Given 02/10/23 1847)  ibuprofen (ADVIL) tablet 800 mg (800 mg Oral Given 02/10/23 1847)    ED Course/ Medical Decision Making/ A&P                             Medical Decision Making Amount and/or Complexity of Data Reviewed Radiology: ordered.  Risk Prescription drug management.   Patient is a 27 year old female with no significant past medical history who presents the ED today for intermittent sharp, stabbing neck pain radiating to her shoulders bilaterally for the past several weeks as well as a sore throat for the past 2 days. My differentials include: bacterial vs viral pharyngitis, radiculopathy, muscle strain, etc. Social determinants of health: social connection, access to healthcare.  On exam, patient has edematous tonsils with exudates. Anterior cervical LAD appreciated. Patient maintains full range of motion of neck and upper extremities. No tenderness to palpation. Strength and sensation intact.  Strep swab was negative. Discussed results with patient. She requested neck imaging.  CT cervical spine should enlarged adenoids, otherwise unremarkable.  Viscous lidocaine and Ibuprofen given for pain. Patient reports improvement on reevaluation.  Discussed CT results with patient. She will follow up with  her PCP. Prescription for Naproxen sent to pharmacy for neck pain and viscous Lidocaine for sore throat. Return precaution provided. Patient stable and safe for discharge home.        Final Clinical Impression(s) / ED Diagnoses Final diagnoses:  Viral pharyngitis  Neck pain    Rx / DC Orders ED Discharge Orders          Ordered    lidocaine (XYLOCAINE) 2 % solution  As needed        02/10/23 1937    naproxen (NAPROSYN) 500 MG tablet  2 times daily PRN        02/10/23 1937              Maxwell Marion, PA-C 02/10/23 2003    Wynetta Fines, MD 02/10/23 2246

## 2023-02-10 NOTE — Discharge Instructions (Signed)
As discussed, you have viral pharyngitis that is causing your sore throat. You can use viscous lidocaine as needed for pain.  Your neck imaging is unremarkable. Take Naproxen twice a day as needed for neck and shoulder pain.  Follow up with your PCP for further evaluation.  Get help right away if: You have a stiff neck. You drool or cannot swallow liquids. You cannot drink or take medicines without vomiting. You have very bad pain that does not go away with medicine. You have problems breathing, and it is not from a stuffy nose. You have new pain and swelling in your knees, ankles, wrists, or elbows.

## 2023-02-10 NOTE — ED Triage Notes (Signed)
Pt c/o x1 month of neck pain radiating into BUE described as stabbing pain. Also c/o x3 days of sore throat, noting white patches on her throat today.

## 2023-02-12 ENCOUNTER — Telehealth: Payer: Self-pay | Admitting: Family Medicine

## 2023-02-12 NOTE — Telephone Encounter (Signed)
Copied from CRM [id number]. Topic: Referral - Request for Referral >> Feb 12, 2023  1:02 PM Epimenio Foot F wrote: Pt is calling in because she has a NP appointment scheduled for August, and says she has been having nerve pain. Pt wants to know if she could get a referral put in to [facility]- (Fax)-[phone] , prior to her appointment since the appointment is so far out. Please advise.

## 2023-02-15 ENCOUNTER — Ambulatory Visit: Payer: Self-pay | Admitting: *Deleted

## 2023-02-15 NOTE — Telephone Encounter (Signed)
Reason for Disposition  Back pain is a chronic symptom (recurrent or ongoing AND present > 4 weeks)    Has a new pt appt 03/28/2023.   Suggested she go to the urgent care in the mean time.  Answer Assessment - Initial Assessment Questions 1. ONSET: "When did the pain begin?"      I'm having pain all over my body.    It started a month ago.  I'm having body aches in my shoulders, neck, cramping in my calves, and pain down both of my arms.   I'm having pain in my neck that goes down my back and into both of my shoulders and arms. I went to the hospital and they gave me naproxen but it does not seem to be helping.  I have a new pt appt with Dr. Georganna Skeans at [facility] for March 28, 2023.   I was wanting to see if she could see me sooner.    I let her know that was the soonest we could get her in for a new pt appt. But once she was established she would be able to be seen at the practice.   I suggested she go to the urgent care since the naproxen wasn't helping.   2. LOCATION: "Where does it hurt?" (upper, mid or lower back)     She had several complaints,  she also c/o having stones in her tonsils.   They are growing.   They were on the right side but now they are on the left side too.    I have a sore throat.   My Strep test at the hospital was negative.   They did not test me for Covid or flu when I asked her about it. Denies having a runny nose.     "I'm just having body aches".  I think I might have a pinched nerve in my neck.  The pain goes from my neck to my arms and shoulders.   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  3. SEVERITY: "How bad is the pain?"  (e.g., Scale 1-10; mild, moderate, or severe)   - MILD (1-3): Doesn't interfere with normal activities.    - MODERATE (4-7): Interferes with normal activities or awakens from sleep.    - SEVERE (8-10): Excruciating pain, unable to do any normal  activities.      Moderate pain all over 4. PATTERN: "Is the pain constant?" (e.g., yes, no; constant, intermittent)      constant 5. RADIATION: "Does the pain shoot into your legs or somewhere else?"     See above 6. CAUSE:  "What do you think is causing the back pain?"      I think I might have a pinched nerve in my neck.    I don't know why I'm having the body aches all over. 7. BACK OVERUSE:  "Any recent lifting of heavy objects, strenuous work or exercise?"     Back in May I was lifting heavy computers on my job.   It might have happened then because the neck pain did not start until several weeks after that. 8. MEDICINES: "What have you taken so far for the pain?" (e.g., nothing, acetaminophen, NSAIDS)     Taking Naproxen but it doesn't seem to be helping. 9. NEUROLOGIC SYMPTOMS: "Do you have any weakness, numbness, or  problems with bowel/bladder control?"     No 10. OTHER SYMPTOMS: "Do you have any other symptoms?" (e.g., fever, abdomen pain, burning with urination, blood in urine)       See above.    Pt mentioned several issues. 11. PREGNANCY: "Is there any chance you are pregnant?" "When was your last menstrual period?"       Not asked  Protocols used: Back Pain-A-AH

## 2023-02-15 NOTE — Telephone Encounter (Signed)
  Chief Complaint: Pain all over her body, sore throat, tonsil stones that are growing, back pain, shoulder pain and neck pain. Symptoms: several issues mentioned.    Frequency: for the last month Pertinent Negatives: Patient denies accidents or injuries to cause the body aches and pains.   Disposition: [] ED /[] Urgent Care (no appt availability in office) / [x] Appointment(In office/virtual)/ []  [facility]/ [] Home Care/ [] Refused Recommended Disposition /[] [facility]/ []  Follow-up with PCP Additional Notes: Referred to the urgent care.   She has a new pt appt on 03/28/2023 with Dr. Georganna Skeans at [facility].

## 2023-02-24 ENCOUNTER — Emergency Department (HOSPITAL_COMMUNITY)
Admission: EM | Admit: 2023-02-24 | Discharge: 2023-02-24 | Disposition: A | Discharge status: Home / Self Care | Payer: Medicaid Other | Attending: Emergency Medicine | Admitting: Emergency Medicine

## 2023-02-24 ENCOUNTER — Encounter (HOSPITAL_COMMUNITY): Payer: Self-pay

## 2023-02-24 DIAGNOSIS — R52 Pain, unspecified: Secondary | ICD-10-CM | POA: Insufficient documentation

## 2023-02-24 DIAGNOSIS — Z202 Contact with and (suspected) exposure to infections with a predominantly sexual mode of transmission: Secondary | ICD-10-CM | POA: Diagnosis not present

## 2023-02-24 DIAGNOSIS — B349 Viral infection, unspecified: Secondary | ICD-10-CM

## 2023-02-24 DIAGNOSIS — Z1152 Encounter for screening for COVID-19: Secondary | ICD-10-CM | POA: Insufficient documentation

## 2023-02-24 LAB — URINALYSIS, W/ REFLEX TO CULTURE (INFECTION SUSPECTED)
Bacteria, UA: NONE SEEN
Bilirubin Urine: NEGATIVE
Glucose, UA: NEGATIVE mg/dL
Ketones, ur: NEGATIVE mg/dL
Leukocytes,Ua: NEGATIVE
Nitrite: NEGATIVE
Protein, ur: NEGATIVE mg/dL
Specific Gravity, Urine: 1.009 (ref 1.005–1.030)
pH: 7 (ref 5.0–8.0)

## 2023-02-24 LAB — WET PREP, GENITAL
Clue Cells Wet Prep HPF POC: NONE SEEN
Sperm: NONE SEEN
Trich, Wet Prep: NONE SEEN
WBC, Wet Prep HPF POC: <10 (ref <10)
Yeast Wet Prep HPF POC: NONE SEEN

## 2023-02-24 LAB — CBC WITH DIFFERENTIAL/PLATELET
Abs Immature Granulocytes: 0.03 10*3/uL (ref 0.00–0.07)
Basophils Absolute: 0 10*3/uL (ref 0.0–0.1)
Basophils Relative: 0 %
Eosinophils Absolute: 0 10*3/uL (ref 0.0–0.5)
Eosinophils Relative: 1 %
HCT: 41 % (ref 36.0–46.0)
Hemoglobin: 13.6 g/dL (ref 12.0–15.0)
Immature Granulocytes: 0 %
Lymphocytes Relative: 18 %
Lymphs Abs: 1.5 10*3/uL (ref 0.7–4.0)
MCH: 30 pg (ref 26.0–34.0)
MCHC: 33.2 g/dL (ref 30.0–36.0)
MCV: 90.3 fL (ref 80.0–100.0)
Monocytes Absolute: 0.5 10*3/uL (ref 0.1–1.0)
Monocytes Relative: 6 %
Neutro Abs: 6.4 10*3/uL (ref 1.7–7.7)
Neutrophils Relative %: 75 %
Platelets: 364 10*3/uL (ref 150–400)
RBC: 4.54 MIL/uL (ref 3.87–5.11)
RDW: 11.8 % (ref 11.5–15.5)
WBC: 8.4 10*3/uL (ref 4.0–10.5)
nRBC: 0 % (ref 0.0–0.2)

## 2023-02-24 LAB — RESP PANEL BY RT-PCR (RSV, FLU A&B, COVID)  RVPGX2
Influenza A by PCR: NEGATIVE
Influenza B by PCR: NEGATIVE
Resp Syncytial Virus by PCR: NEGATIVE
SARS Coronavirus 2 by RT PCR: NEGATIVE

## 2023-02-24 LAB — BASIC METABOLIC PANEL
Anion gap: 10 (ref 5–15)
BUN: 9 mg/dL (ref 6–20)
CO2: 21 mmol/L — ABNORMAL LOW (ref 22–32)
Calcium: 9.5 mg/dL (ref 8.9–10.3)
Chloride: 106 mmol/L (ref 98–111)
Creatinine, Ser: 0.69 mg/dL (ref 0.44–1.00)
GFR, Estimated: >60 mL/min (ref >60)
Glucose, Bld: 98 mg/dL (ref 70–99)
Potassium: 4.2 mmol/L (ref 3.5–5.1)
Sodium: 137 mmol/L (ref 135–145)

## 2023-02-24 LAB — TROPONIN I (HIGH SENSITIVITY): Troponin I (High Sensitivity): <2 ng/L (ref <18)

## 2023-02-24 LAB — HIV ANTIBODY (ROUTINE TESTING W REFLEX): HIV Screen 4th Generation wRfx: NONREACTIVE

## 2023-02-24 LAB — D-DIMER, QUANTITATIVE: D-Dimer, Quant: 0.46 ug/mL-FEU (ref 0.00–0.50)

## 2023-02-24 MED ORDER — DOXYCYCLINE HYCLATE 100 MG PO TABS
100.0000 mg | ORAL_TABLET | Freq: Once | ORAL | Status: AC
  Administered 2023-02-24: 100 mg via ORAL
  Filled 2023-02-24: qty 1

## 2023-02-24 MED ORDER — CEFTRIAXONE SODIUM 1 G IJ SOLR: 1.0000 g | INTRAMUSCULAR | Status: DC

## 2023-02-24 MED ORDER — CEFTRIAXONE SODIUM 1 G IJ SOLR
500.0000 mg | Freq: Once | INTRAMUSCULAR | Status: AC
  Administered 2023-02-24: 500 mg via INTRAMUSCULAR
  Filled 2023-02-24: qty 10

## 2023-02-24 MED ORDER — DOXYCYCLINE HYCLATE 100 MG PO CAPS: 100.0000 mg | ORAL_CAPSULE | Freq: Two times a day (BID) | ORAL | 0 refills | Status: AC

## 2023-02-24 MED ORDER — STERILE WATER FOR INJECTION IJ SOLN
INTRAMUSCULAR | Status: AC
  Filled 2023-02-24: qty 10

## 2023-02-24 NOTE — ED Triage Notes (Signed)
Pt arrived via POV, c/o SOB and generalized body aches. Denies any sick contacts, no fevers/chills or cough. States intermittent SOB x1 month.    HR 100-120 in triage, states tachy at baseline. Follows with cards.

## 2023-02-24 NOTE — ED Provider Notes (Signed)
[place] EMERGENCY DEPARTMENT AT [facility] Provider Note   CSN: [id number] Arrival date & time: 02/24/23  1601     History {Add pertinent medical, surgical, social history, OB history to HPI:1} Chief Complaint  Patient presents with   Generalized Body Aches    Brittney Wyatt is a 27 y.o. female.concerned for intermittent SOB and body aches x1 month. Endorses nausea and diarrhea this morning that has since resolved. Also stating that she has lost around 10lbs in the past month. Depo since may. D&C in may. No history of asthma. Chest tightness after w. Also a little dysuria and increased urinary frequency.   Denies fever, abdominal pain, nausea, vomiting, sycnope. Denies hx DVT/PE, cough,  recent immobilization.   HPI     Home Medications Prior to Admission medications   Medication Sig Start Date End Date Taking? Authorizing Provider  aspirin EC 81 MG tablet Take 81 mg by mouth every 6 (six) hours as needed for mild pain. Swallow whole.    [provider]  naproxen (NAPROSYN) 500 MG tablet Take 1 tablet (500 mg total) by mouth 2 (two) times daily as needed. 02/10/23 05/11/23  Maxwell Marion, PA-C  promethazine (PHENERGAN) 12.5 MG tablet Take 1 tablet (12.5 mg total) by mouth every 6 (six) hours as needed. Patient not taking: Reported on 02/10/2023 11/16/22   Adline Potter, NP      Allergies    Patient has no known allergies.    Review of Systems   Review of Systems  Respiratory:  Positive for shortness of breath.     Physical Exam Updated Vital Signs BP 130/87 (BP Location: Left Arm)   Pulse (!) 104   Temp 98.3 F (36.8 C) (Oral)   Resp 15   LMP 09/22/2022   SpO2 100%   Breastfeeding No  Physical Exam Vitals and nursing note reviewed.  Constitutional:      General: She is not in acute distress.    Appearance: She is not ill-appearing or toxic-appearing.  HENT:     Head: Normocephalic and atraumatic.     Mouth/Throat:     Mouth: Mucous  membranes are moist.  Eyes:     General: No scleral icterus.       Right eye: No discharge.        Left eye: No discharge.     Conjunctiva/sclera: Conjunctivae normal.  Cardiovascular:     Rate and Rhythm: Normal rate and regular rhythm.     Pulses: Normal pulses.     Heart sounds: Normal heart sounds. No murmur heard. Pulmonary:     Effort: Pulmonary effort is normal. No respiratory distress.     Breath sounds: Normal breath sounds. No wheezing, rhonchi or rales.  Abdominal:     General: Abdomen is flat. Bowel sounds are normal. There is no distension.     Palpations: Abdomen is soft. There is no mass.     Tenderness: There is no abdominal tenderness.  Musculoskeletal:     Cervical back: Normal range of motion.     Right lower leg: No edema.     Left lower leg: No edema.  Skin:    General: Skin is warm and dry.     Capillary Refill: Capillary refill takes less than 2 seconds.     Findings: No rash.  Neurological:     General: No focal deficit present.     Mental Status: She is alert and oriented to person, place, and time. Mental status is at  baseline.     Sensory: No sensory deficit.     Motor: No weakness.  Psychiatric:        Mood and Affect: Mood normal.        Behavior: Behavior normal.     ED Results / Procedures / Treatments   Labs (all labs ordered are listed, but only abnormal results are displayed) Labs Reviewed  RESP PANEL BY RT-PCR (RSV, FLU A&B, COVID)  [id number]    EKG None  Radiology No results found.  Procedures Procedures  {Document cardiac monitor, telemetry assessment procedure when appropriate:1}  Medications Ordered in ED Medications - No data to display  ED Course/ Medical Decision Making/ A&P   {   Click here for ABCD2, HEART and other calculatorsREFRESH Note before signing :1}                          Medical Decision Making Amount and/or Complexity of Data Reviewed Labs: ordered.  Risk Prescription drug  management.   *** Patient tachycardic at baseline and following with cards. Patient concerned for blood clot - ddimer negative. Patient concerned for STD but denies vaginal discharge. Educted patient that I do not believe that she has STD, but patient still requesting ABX and testing.   {Document critical care time when appropriate:1} {Document review of labs and clinical decision tools ie heart score, Chads2Vasc2 etc:1}  {Document your independent review of radiology images, and any outside records:1} {Document your discussion with family members, caretakers, and with consultants:1} {Document social determinants of health affecting pt's care:1} {Document your decision making why or why not admission, treatments were needed:1} Final Clinical Impression(s) / ED Diagnoses Final diagnoses:  None    Rx / DC Orders ED Discharge Orders     None

## 2023-02-24 NOTE — Discharge Instructions (Addendum)
Is a pleasure caring for you today.  Lab workup today was reassuring.  Seek emergency care if experiencing any new or worsening symptoms.

## 2023-02-25 LAB — GC/CHLAMYDIA PROBE AMP (~~LOC~~) NOT AT ARMC
Chlamydia: NEGATIVE
Comment: NEGATIVE
Comment: NORMAL
Neisseria Gonorrhea: NEGATIVE

## 2023-02-25 LAB — RPR: RPR Ser Ql: NONREACTIVE

## 2023-03-28 ENCOUNTER — Ambulatory Visit: Payer: Medicaid Other | Admitting: Family Medicine

## 2024-02-28 ENCOUNTER — Encounter (HOSPITAL_COMMUNITY): Payer: Self-pay

## 2024-02-28 ENCOUNTER — Emergency Department (HOSPITAL_COMMUNITY)

## 2024-02-28 ENCOUNTER — Other Ambulatory Visit: Payer: Self-pay

## 2024-02-28 ENCOUNTER — Emergency Department (HOSPITAL_COMMUNITY)
Admission: EM | Admit: 2024-02-28 | Discharge: 2024-02-28 | Disposition: A | Discharge status: Home / Self Care | Source: Ambulatory Visit | Attending: Emergency Medicine | Admitting: Emergency Medicine

## 2024-02-28 DIAGNOSIS — O26891 Other specified pregnancy related conditions, first trimester: Secondary | ICD-10-CM | POA: Insufficient documentation

## 2024-02-28 DIAGNOSIS — O3680X0 Pregnancy with inconclusive fetal viability, not applicable or unspecified: Secondary | ICD-10-CM

## 2024-02-28 DIAGNOSIS — Z3A01 Less than 8 weeks gestation of pregnancy: Secondary | ICD-10-CM | POA: Diagnosis not present

## 2024-02-28 DIAGNOSIS — Z7982 Long term (current) use of aspirin: Secondary | ICD-10-CM | POA: Insufficient documentation

## 2024-02-28 DIAGNOSIS — R109 Unspecified abdominal pain: Secondary | ICD-10-CM | POA: Insufficient documentation

## 2024-02-28 LAB — CBC WITH DIFFERENTIAL/PLATELET
Abs Immature Granulocytes: 0.04 K/uL (ref 0.00–0.07)
Basophils Absolute: 0 K/uL (ref 0.0–0.1)
Basophils Relative: 0 %
Eosinophils Absolute: 0.1 K/uL (ref 0.0–0.5)
Eosinophils Relative: 1 %
HCT: 40.5 % (ref 36.0–46.0)
Hemoglobin: 13.5 g/dL (ref 12.0–15.0)
Immature Granulocytes: 0 %
Lymphocytes Relative: 21 %
Lymphs Abs: 1.9 K/uL (ref 0.7–4.0)
MCH: 31.3 pg (ref 26.0–34.0)
MCHC: 33.3 g/dL (ref 30.0–36.0)
MCV: 93.8 fL (ref 80.0–100.0)
Monocytes Absolute: 0.6 K/uL (ref 0.1–1.0)
Monocytes Relative: 6 %
Neutro Abs: 6.7 K/uL (ref 1.7–7.7)
Neutrophils Relative %: 72 %
Platelets: 319 K/uL (ref 150–400)
RBC: 4.32 MIL/uL (ref 3.87–5.11)
RDW: 11.6 % (ref 11.5–15.5)
WBC: 9.3 K/uL (ref 4.0–10.5)
nRBC: 0 % (ref 0.0–0.2)

## 2024-02-28 LAB — BASIC METABOLIC PANEL WITH GFR
Anion gap: 8 (ref 5–15)
BUN: 10 mg/dL (ref 6–20)
CO2: 24 mmol/L (ref 22–32)
Calcium: 9.6 mg/dL (ref 8.9–10.3)
Chloride: 104 mmol/L (ref 98–111)
Creatinine, Ser: 0.52 mg/dL (ref 0.44–1.00)
GFR, Estimated: >60 mL/min (ref >60)
Glucose, Bld: 88 mg/dL (ref 70–99)
Potassium: 3.5 mmol/L (ref 3.5–5.1)
Sodium: 136 mmol/L (ref 135–145)

## 2024-02-28 LAB — URINALYSIS, ROUTINE W REFLEX MICROSCOPIC
Bilirubin Urine: NEGATIVE
Glucose, UA: NEGATIVE mg/dL
Hgb urine dipstick: NEGATIVE
Ketones, ur: NEGATIVE mg/dL
Leukocytes,Ua: NEGATIVE
Nitrite: NEGATIVE
Protein, ur: NEGATIVE mg/dL
Specific Gravity, Urine: 1.005 (ref 1.005–1.030)
pH: 7 (ref 5.0–8.0)

## 2024-02-28 LAB — HCG, QUANTITATIVE, PREGNANCY: hCG, Beta Chain, Quant, S: 361 m[IU]/mL — ABNORMAL HIGH (ref <5)

## 2024-02-28 NOTE — ED Triage Notes (Signed)
 POV/ by self/ sent by OB for possible ectopic pregnancy/ pt is approx 5-[redacted] weeks pregnant/ c/o left lower abd cramping/ denies bleeding/ pt is ambulatory and A&OX4

## 2024-02-28 NOTE — Discharge Instructions (Signed)
 Pleasure taking care of you here today.  As we discussed the ultrasound did not show a pregnancy.  You need to repeat labs drawn on Monday.  If your symptoms worsen you need to go to the maternity assessment unit at [place] as we discussed in the room.  This is ENTRANCE C to be seen by an OB/GYN

## 2024-02-28 NOTE — ED Provider Notes (Signed)
 [place] EMERGENCY DEPARTMENT AT [facility] Provider Note   CSN: [id number] Arrival date & time: 02/28/24  1110    Patient presents with: Abdominal Cramping   Brittney Wyatt is a 28 y.o. female here for evaluation of possible ectopic pregnancy.  Patient states she is approximately [redacted] weeks pregnant.  Seen by OB/GYN 7/29 had hCG drawn which was 1721.  She was seen again due to persistent left lower quadrant pain had ultrasound performed in office which showed something on the left side she was sent here for management of possible ectopic pregnancy.  No nausea, vomiting, dysuria, vaginal bleeding.  Obgyn Dr. Glena @ [facility]   HPI     Prior to Admission medications   Medication Sig Start Date End Date Taking? Authorizing Provider  aspirin EC 81 MG tablet Take 81 mg by mouth every 6 (six) hours as needed for mild pain. Swallow whole.    [provider]  promethazine  (PHENERGAN ) 12.5 MG tablet Take 1 tablet (12.5 mg total) by mouth every 6 (six) hours as needed. Patient not taking: Reported on 02/10/2023 11/16/22   Signa Delon LABOR, NP    Allergies: Patient has no known allergies.    Review of Systems  Constitutional: Negative.   HENT: Negative.    Respiratory: Negative.    Cardiovascular: Negative.   Gastrointestinal: Negative.   Genitourinary:  Positive for pelvic pain. Negative for decreased urine volume, difficulty urinating, dysuria, flank pain, frequency, menstrual problem, urgency, vaginal bleeding, vaginal discharge and vaginal pain.  Musculoskeletal: Negative.   Skin: Negative.   Neurological: Negative.   All other systems reviewed and are negative.   Updated Vital Signs BP 126/81 (BP Location: Right Arm)   Pulse 80   Temp 98.1 F (36.7 C) (Oral)   Resp 17   Wt 68.9 kg   LMP 01/17/2024 (Exact Date)   SpO2 100%   BMI 26.09 kg/m   Physical Exam Vitals and nursing note reviewed.  Constitutional:       General: She is not in acute distress.    Appearance: She is well-developed. She is not ill-appearing, toxic-appearing or diaphoretic.  HENT:     Head: Atraumatic.  Eyes:     Pupils: Pupils are equal, round, and reactive to light.  Cardiovascular:     Rate and Rhythm: Normal rate.     Pulses: Normal pulses.     Heart sounds: Normal heart sounds.  Pulmonary:     Effort: Pulmonary effort is normal. No respiratory distress.     Breath sounds: Normal breath sounds.  Abdominal:     General: Bowel sounds are normal. There is no distension.     Palpations: Abdomen is soft.  Musculoskeletal:        General: Normal range of motion.     Cervical back: Normal range of motion.  Skin:    General: Skin is warm and dry.     Capillary Refill: Capillary refill takes less than 2 seconds.  Neurological:     General: No focal deficit present.     Mental Status: She is alert.  Psychiatric:        Mood and Affect: Mood normal.     (all labs ordered are listed, but only abnormal results are displayed) Labs Reviewed  HCG, QUANTITATIVE, PREGNANCY - Abnormal; Notable for the following components:      Result Value   hCG, Beta Chain, Quant, S 361 (*)    All other components within normal limits  URINALYSIS, ROUTINE W REFLEX MICROSCOPIC - Abnormal; Notable for the following components:   Color, Urine STRAW (*)    All other components within normal limits  BASIC METABOLIC PANEL WITH GFR  CBC WITH DIFFERENTIAL/PLATELET    EKG: None  Radiology: US  OB Comp < 14 Wks Result Date: 02/28/2024 CLINICAL DATA:  pain. EXAM: OBSTETRIC <14 WK US  AND TRANSVAGINAL OB US  TECHNIQUE: Both transabdominal and transvaginal ultrasound examinations were performed for complete evaluation of the gestation as well as the maternal uterus, adnexal regions, and pelvic cul-de-sac. Transvaginal technique was performed to assess early pregnancy. COMPARISON:  None Available. FINDINGS: The technologist noted difficult study due to  bowel gas. Intrauterine gestational sac: None. Maternal uterus/adnexae: Normal-sized anteverted uterus. No focal mass. Endometrial thickness up to 8-9 mm.  No focal mass. Left ovary measures up to 1.8 x 2.3 x 2.4 cm, volume 5.3 cm. Right ovary measures 1.6 x 2.2 x 3.7 cm, volume 6.8 cm. A corpus luteal cyst noted in the right ovary. IMPRESSION: *No intrauterine gestation is seen and no adnexal mass is evident. These findings represent a pregnancy of unknown location. The sonographic differential diagnosis includes: an intrauterine gestation too early to visualize, a spontaneous abortion, or an occult ectopic pregnancy. Continued close clinical follow-up, serial serum beta-HCG levels and short term sonographic follow up in 7-14 days, or earlier if clinically indicated, is recommended Electronically Signed   By: Ree Molt M.D.   On: 02/28/2024 14:37   US  OB Transvaginal Result Date: 02/28/2024 CLINICAL DATA:  pain. EXAM: OBSTETRIC <14 WK US  AND TRANSVAGINAL OB US  TECHNIQUE: Both transabdominal and transvaginal ultrasound examinations were performed for complete evaluation of the gestation as well as the maternal uterus, adnexal regions, and pelvic cul-de-sac. Transvaginal technique was performed to assess early pregnancy. COMPARISON:  None Available. FINDINGS: The technologist noted difficult study due to bowel gas. Intrauterine gestational sac: None. Maternal uterus/adnexae: Normal-sized anteverted uterus. No focal mass. Endometrial thickness up to 8-9 mm.  No focal mass. Left ovary measures up to 1.8 x 2.3 x 2.4 cm, volume 5.3 cm. Right ovary measures 1.6 x 2.2 x 3.7 cm, volume 6.8 cm. A corpus luteal cyst noted in the right ovary. IMPRESSION: *No intrauterine gestation is seen and no adnexal mass is evident. These findings represent a pregnancy of unknown location. The sonographic differential diagnosis includes: an intrauterine gestation too early to visualize, a spontaneous abortion, or an occult ectopic  pregnancy. Continued close clinical follow-up, serial serum beta-HCG levels and short term sonographic follow up in 7-14 days, or earlier if clinically indicated, is recommended Electronically Signed   By: Ree Molt M.D.   On: 02/28/2024 14:37     Procedures   Medications Ordered in the ED - No data to display  28 year old here for evaluation of possible ectopic pregnancy.  Approximately [redacted] weeks pregnant, seen by OB/GYN 7/21 in [place]  had hCG drawn which was 1721.  Has had intermittent left lower abdominal cramping over the last week.  Had ultrasound performed at OB/GYN according to patient which was concerning for ectopic pregnancy she was subsequently sent here for evaluation.  Patient has no urinary symptoms.  No vaginal bleeding.    Labs and imaging personally viewed and interpreted:  CBC without leukocytosis Metabolic panel without significant abnormality UA negative for infection or blood hCG 361 Ultrasound with no IUP   Discussed with Dr. Jayne OB/GYN who personally looked at patient's labs and imaging.  He does not see ectopic pregnancy at this time.  He  recommends follow-up on Monday with OB/GYN for repeat labs.  I discussed with patient at bedside.  She will follow-up with her OB/GYN or return for any worsening symptoms.  We did discuss MAU over at [facility] as well.   Patient is nontoxic, nonseptic appearing, in no apparent distress.  Patient's pain and other symptoms adequately managed in emergency department.  Fluid bolus given.  Labs, imaging and vitals reviewed.  Patient does not meet the SIRS or Sepsis criteria.  On repeat exam patient does not have a surgical abdomin and there are no peritoneal signs.  No indication of appendicitis, bowel obstruction, bowel perforation, cholecystitis, diverticulitis, PID, intermittent, persistent torsion, TOA, ectopic pregnancy, AAA, dissection, traumatic injury.  Patient discharged home with symptomatic treatment and given strict  instructions for follow-up with their primary care physician.  I have also discussed reasons to return immediately to the ER.  Patient expresses understanding and agrees with plan.                                    Medical Decision Making Amount and/or Complexity of Data Reviewed External Data Reviewed: labs, radiology and notes. Labs: ordered. Decision-making details documented in ED Course. Radiology: ordered and independent interpretation performed. Decision-making details documented in ED Course.  Risk OTC drugs. Decision regarding hospitalization. Diagnosis or treatment significantly limited by social determinants of health.       Final diagnoses:  Pregnancy, location unknown    ED Discharge Orders     None          Yoselyn Mcglade A, PA-C 02/28/24 1709    Pamella Ozell LABOR, DO 03/01/24 1837

## 2024-02-28 NOTE — ED Notes (Signed)
 Patient transported to [place]

## 2024-02-28 NOTE — ED Provider Triage Note (Signed)
 Emergency Medicine Provider Triage Evaluation Note  Brittney Wyatt , a 28 y.o. female  was evaluated in triage.  Pt complains of left-sided abdominal cramping, approximately [redacted] weeks pregnant.  Sent by OB/GYN for ectopic rule out  Review of Systems  Positive: Cramping pain Negative: Vaginal bleeding, discharge, fever, chills, nausea, vomiting  Physical Exam  BP 132/79 (BP Location: Left Arm)   Pulse 77   Temp 98 F (36.7 C) (Oral)   Resp 17   Wt 68.9 kg   LMP 01/17/2024 (Exact Date)   SpO2 100%   BMI 26.09 kg/m  Gen:   Awake, no distress   Resp:  Normal effort  MSK:   Moves extremities without difficulty  Other:    Medical Decision Making  Medically screening exam initiated at 11:41 AM.  Appropriate orders placed.  Brittney Wyatt was informed that the remainder of the evaluation will be completed by another provider, this initial triage assessment does not replace that evaluation, and the importance of remaining in the ED until their evaluation is complete.  Labs and imaging ordered   Brittney Wyatt 02/28/24 1143
